# Patient Record
Sex: Female | Born: 1960 | Race: White | Hispanic: No | Marital: Married | Smoking: Never smoker
Health system: Southern US, Community
[De-identification: ages and names within clinical notes are randomized; demographics above are authoritative.]

## PROBLEM LIST (undated history)

## (undated) DIAGNOSIS — I639 Cerebral infarction, unspecified: Secondary | ICD-10-CM

## (undated) DIAGNOSIS — J45909 Unspecified asthma, uncomplicated: Secondary | ICD-10-CM

## (undated) DIAGNOSIS — M1990 Unspecified osteoarthritis, unspecified site: Secondary | ICD-10-CM

## (undated) DIAGNOSIS — F419 Anxiety disorder, unspecified: Secondary | ICD-10-CM

## (undated) DIAGNOSIS — I10 Essential (primary) hypertension: Secondary | ICD-10-CM

## (undated) HISTORY — PX: COSMETIC SURGERY: SHX468

## (undated) HISTORY — PX: TUBAL LIGATION: SHX77

---

## 2015-03-04 ENCOUNTER — Encounter (HOSPITAL_COMMUNITY): Payer: Self-pay | Admitting: *Deleted

## 2015-03-04 ENCOUNTER — Emergency Department (HOSPITAL_COMMUNITY): Payer: Self-pay

## 2015-03-04 ENCOUNTER — Emergency Department (HOSPITAL_COMMUNITY)
Admission: EM | Admit: 2015-03-04 | Discharge: 2015-03-04 | Disposition: A | Discharge status: Home / Self Care | Payer: Self-pay | Attending: Emergency Medicine | Admitting: Emergency Medicine

## 2015-03-04 DIAGNOSIS — J45901 Unspecified asthma with (acute) exacerbation: Secondary | ICD-10-CM | POA: Insufficient documentation

## 2015-03-04 HISTORY — DX: Unspecified asthma, uncomplicated: J45.909

## 2015-03-04 LAB — BASIC METABOLIC PANEL
Anion gap: 8 (ref 5–15)
BUN: 8 mg/dL (ref 6–23)
CALCIUM: 8.8 mg/dL (ref 8.4–10.5)
CHLORIDE: 103 mmol/L (ref 96–112)
CO2: 25 mmol/L (ref 19–32)
Creatinine, Ser: 0.56 mg/dL (ref 0.50–1.10)
GFR calc Af Amer: >90 mL/min (ref >90)
GFR calc non Af Amer: >90 mL/min (ref >90)
Glucose, Bld: 110 mg/dL — ABNORMAL HIGH (ref 70–99)
Potassium: 3.6 mmol/L (ref 3.5–5.1)
Sodium: 136 mmol/L (ref 135–145)

## 2015-03-04 LAB — CBC
HCT: 39.8 % (ref 36.0–46.0)
Hemoglobin: 13.8 g/dL (ref 12.0–15.0)
MCH: 33.3 pg (ref 26.0–34.0)
MCHC: 34.7 g/dL (ref 30.0–36.0)
MCV: 96.1 fL (ref 78.0–100.0)
PLATELETS: 112 10*3/uL — AB (ref 150–400)
RBC: 4.14 MIL/uL (ref 3.87–5.11)
RDW: 12.6 % (ref 11.5–15.5)
WBC: 4 10*3/uL (ref 4.0–10.5)

## 2015-03-04 MED ORDER — IPRATROPIUM-ALBUTEROL 0.5-2.5 (3) MG/3ML IN SOLN
RESPIRATORY_TRACT | Status: AC
  Filled 2015-03-04: qty 3

## 2015-03-04 MED ORDER — IPRATROPIUM-ALBUTEROL 0.5-2.5 (3) MG/3ML IN SOLN
3.0000 mL | Freq: Once | RESPIRATORY_TRACT | Status: AC
  Administered 2015-03-04: 3 mL via RESPIRATORY_TRACT

## 2015-03-04 MED ORDER — METHYLPREDNISOLONE SODIUM SUCC 125 MG IJ SOLR
125.0000 mg | Freq: Once | INTRAMUSCULAR | Status: AC
  Administered 2015-03-04: 125 mg via INTRAVENOUS
  Filled 2015-03-04: qty 2

## 2015-03-04 MED ORDER — ALBUTEROL (5 MG/ML) CONTINUOUS INHALATION SOLN: 10.0000 mg/h | INHALATION_SOLUTION | RESPIRATORY_TRACT | Status: DC

## 2015-03-04 MED ORDER — ALBUTEROL SULFATE HFA 108 (90 BASE) MCG/ACT IN AERS
2.0000 | INHALATION_SPRAY | RESPIRATORY_TRACT | Status: AC
  Administered 2015-03-04: 2 via RESPIRATORY_TRACT
  Filled 2015-03-04: qty 6.7

## 2015-03-04 MED ORDER — ALBUTEROL (5 MG/ML) CONTINUOUS INHALATION SOLN
10.0000 mg/h | INHALATION_SOLUTION | Freq: Once | RESPIRATORY_TRACT | Status: AC
  Administered 2015-03-04: 10 mg/h via RESPIRATORY_TRACT
  Filled 2015-03-04: qty 20

## 2015-03-04 NOTE — Discharge Instructions (Signed)
Please follow the directions provided. Be sure to follow-up with your primary care doctor to ensure you're getting better. Please use your albuterol 2 puffs or neb treatment every 4 hours while you're taking the prednisone. After the course of prednisone is done, you may use only as needed for cough, wheezing or shortness of breath. While taking every 4 hours if you feel you should need it more than 4 hours should return to be reevaluated. Take the prednisone as directed. Don't hesitate to return for any new, worsening, or concerning symptoms.   SEEK IMMEDIATE MEDICAL CARE IF:  You seem to be getting worse and are unresponsive to treatment during an asthma attack.  You are short of breath even at rest.  You get short of breath when doing very little physical activity.  You have difficulty eating, drinking, or talking due to asthma symptoms.  You develop chest pain.  You develop a fast heartbeat.  You have a bluish color to your lips or fingernails.  You are light-headed, dizzy, or faint.

## 2015-03-04 NOTE — ED Provider Notes (Signed)
CSN: [id number]     Arrival date & time 03/04/15  0701 History   First MD Initiated Contact with Patient 03/04/15 [phone]     Chief Complaint  Patient presents with  . Asthma   (Consider location/radiation/quality/duration/timing/severity/associated sxs/prior Treatment) HPI   Sophia Salazar is a 54 yo female presenting with wheezing and shortness of breath. She states she has a history of asthma and her symptoms have been worsening over last 3 days. She states she is not a smoker but she is exposed to cigarette smoke occasionally in the last 3 days she's also been exposed to dust and debris from yard work. She was seen by her PCP yesterday and started on DuoNeb every 4 hours and prednisone without relief of symptoms. She denies productive cough, fevers, chills, chest pain, nausea or vomiting.  Past Medical History  Diagnosis Date  . Asthma    Past Surgical History  Procedure Laterality Date  . Tubal ligation    . Cosmetic surgery      on R arm   No family history on file. History  Substance Use Topics  . Smoking status: Never Smoker   . Smokeless tobacco: Not on file  . Alcohol Use: 1.8 oz/week    3 Glasses of wine per week   OB History    No data available     Review of Systems  Constitutional: Negative for fever and chills.  HENT: Negative for sore throat.   Eyes: Negative for visual disturbance.  Respiratory: Positive for cough, shortness of breath and wheezing. Negative for chest tightness.   Cardiovascular: Negative for chest pain and leg swelling.  Gastrointestinal: Negative for nausea, vomiting and diarrhea.  Genitourinary: Negative for dysuria.  Musculoskeletal: Negative for myalgias.  Skin: Negative for rash.  Neurological: Negative for weakness, numbness and headaches.    Allergies  Review of patient's allergies indicates no known allergies.  Home Medications   Prior to Admission medications   Not on File   BP 198/124 mmHg  Pulse 112  Temp(Src) 97.8 F  (36.6 C) (Oral)  Resp 21  Ht 5\' 7"  (1.702 m)  Wt 170 lb (77.111 kg)  BMI 26.62 kg/m2  SpO2 98% Physical Exam  Constitutional: She appears well-developed and well-nourished. No distress.  HENT:  Head: Normocephalic and atraumatic.  Mouth/Throat: Oropharynx is clear and moist. No oropharyngeal exudate.  Eyes: Conjunctivae are normal.  Neck: Neck supple. No thyromegaly present.  Cardiovascular: Normal rate, regular rhythm and intact distal pulses.   Pulmonary/Chest: Effort normal. Tachypnea noted. No respiratory distress. She has no decreased breath sounds. She has wheezes in the right upper field, the right middle field, the right lower field, the left upper field, the left middle field and the left lower field. She has no rhonchi. She has no rales. She exhibits no tenderness.  Abdominal: Soft. There is no tenderness.  Musculoskeletal: She exhibits no tenderness.  Lymphadenopathy:    She has no cervical adenopathy.  Neurological: She is alert.  Skin: Skin is warm and dry. No rash noted. She is not diaphoretic.  Psychiatric: She has a normal mood and affect.  Nursing note and vitals reviewed.   ED Course  Procedures (including critical care time)  Labs Review Labs Reviewed  CBC - Abnormal; Notable for the following:    Platelets 112 (*)    All other components within normal limits  BASIC METABOLIC PANEL - Abnormal; Notable for the following:    Glucose, Bld 110 (*)    All other  components within normal limits    Imaging Review Dg Chest Port 1 View  03/04/2015   CLINICAL DATA:  Asthma  EXAM: PORTABLE CHEST - 1 VIEW  COMPARISON:  None.  FINDINGS: Cardiomediastinal silhouette is unremarkable. Mild hyperinflation. No infiltrate or pleural effusion. No pulmonary edema.  IMPRESSION: No active disease.  Mild hyperinflation.   Electronically Signed   By: Natasha Mead M.D.   On: 03/04/2015 08:04     EKG Interpretation   Date/Time:  Thursday March 04 2015 07:08:05 EST Ventricular  Rate:  115 PR Interval:  152 QRS Duration: 79 QT Interval:  338 QTC Calculation: 467 R Axis:   73 Text Interpretation:  Sinus tachycardia Artifact in lead(s) II III aVR aVL  aVF V1 V4 V5 V6 No old tracing to compare Confirmed by WARD,  DO, KRISTEN  ([id number]) on 03/04/2015 7:10:12 AM      MDM   Final diagnoses:  Asthma exacerbation   54 yo with asthma exacerbation. Pt with only minimal improvement after duoneb. Continuous neb, IV solumedrol, and CXR done. CXR is negative for acute abnormality.and air movement improved and wheezing decreased after treatment. She ambulated in ED with O2 saturations maintained >94% and, no current signs of respiratory distress. Pt has prescription from PCP for prednisone and albuterol. She has also recently been diagnosed with htn and has yet to start her prescription. She states she is breathing at baseline. Pt has been instructed to continue using prescribed medications and to speak with PCP about today's exacerbation. Pt is well-appearing, in no acute distress and vital signs reviewed and not concerning. She appears safe to be discharged.  Return precautions provided. Pt aware of plan and in agreement.    Filed Vitals:   03/04/15 0800 03/04/15 0830 03/04/15 0855 03/04/15 0925  BP: 187/95 174/92 174/92   Pulse: 94 116 119 116  Temp:   98.2 F (36.8 C)   TempSrc:   Oral   Resp: Height:      Weight:      SpO2: 100% 100% 95% 94%   Meds given in ED:  Medications  ipratropium-albuterol (DUONEB) 0.5-2.5 (3) MG/3ML nebulizer solution ( Nebulization Duplicate 03/04/15 0710)  ipratropium-albuterol (DUONEB) 0.5-2.5 (3) MG/3ML nebulizer solution 3 mL (3 mLs Nebulization Given 03/04/15 0710)  methylPREDNISolone sodium succinate (SOLU-MEDROL) 125 mg/2 mL injection 125 mg (125 mg Intravenous Given 03/04/15 0739)  albuterol (PROVENTIL,VENTOLIN) solution continuous neb (10 mg/hr Nebulization Given 03/04/15 0735)  albuterol (PROVENTIL HFA;VENTOLIN HFA) 108  (90 BASE) MCG/ACT inhaler 2 puff (2 puffs Inhalation Given 03/04/15 0922)    Discharge Medication List as of 03/04/2015  9:11 AM         Harle Battiest, NP 03/05/15 4010  Toy Cookey, MD 03/05/15 [phone]

## 2015-03-04 NOTE — ED Notes (Signed)
Pt in from home c/o [place] worsening x 3 days, pt seen pcp yesterday with rx of inhaler, abx, & prednisone, pt did not get Rx, pt has auditory wheezes & SOB with non productive cough upon arrival to ED, pt speaks in short sentences

## 2016-05-14 IMAGING — CR DG CHEST 1V PORT
2 series · 2 of 2 positions shown · non-contrast
Comparison: None.

CLINICAL DATA: Asthma

EXAM:
PORTABLE CHEST - 1 VIEW

[AP (1 of 2)]
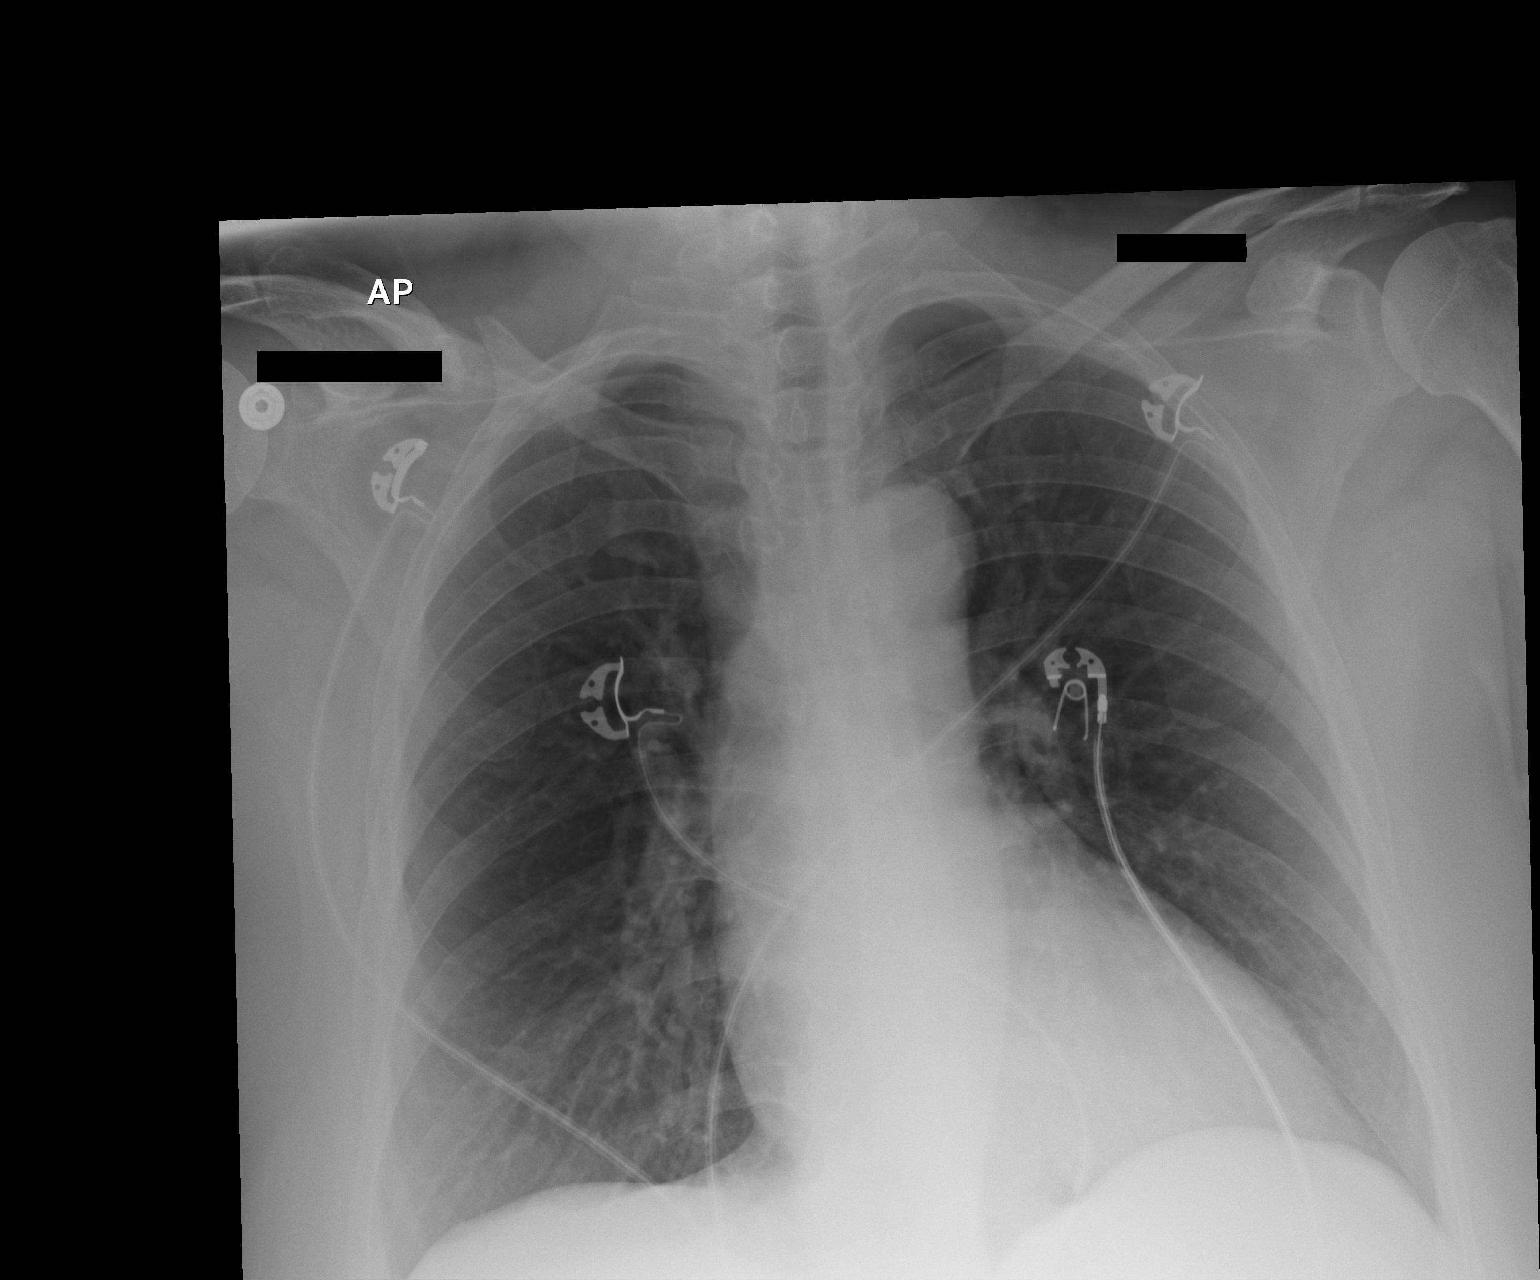

[AP (2 of 2)]
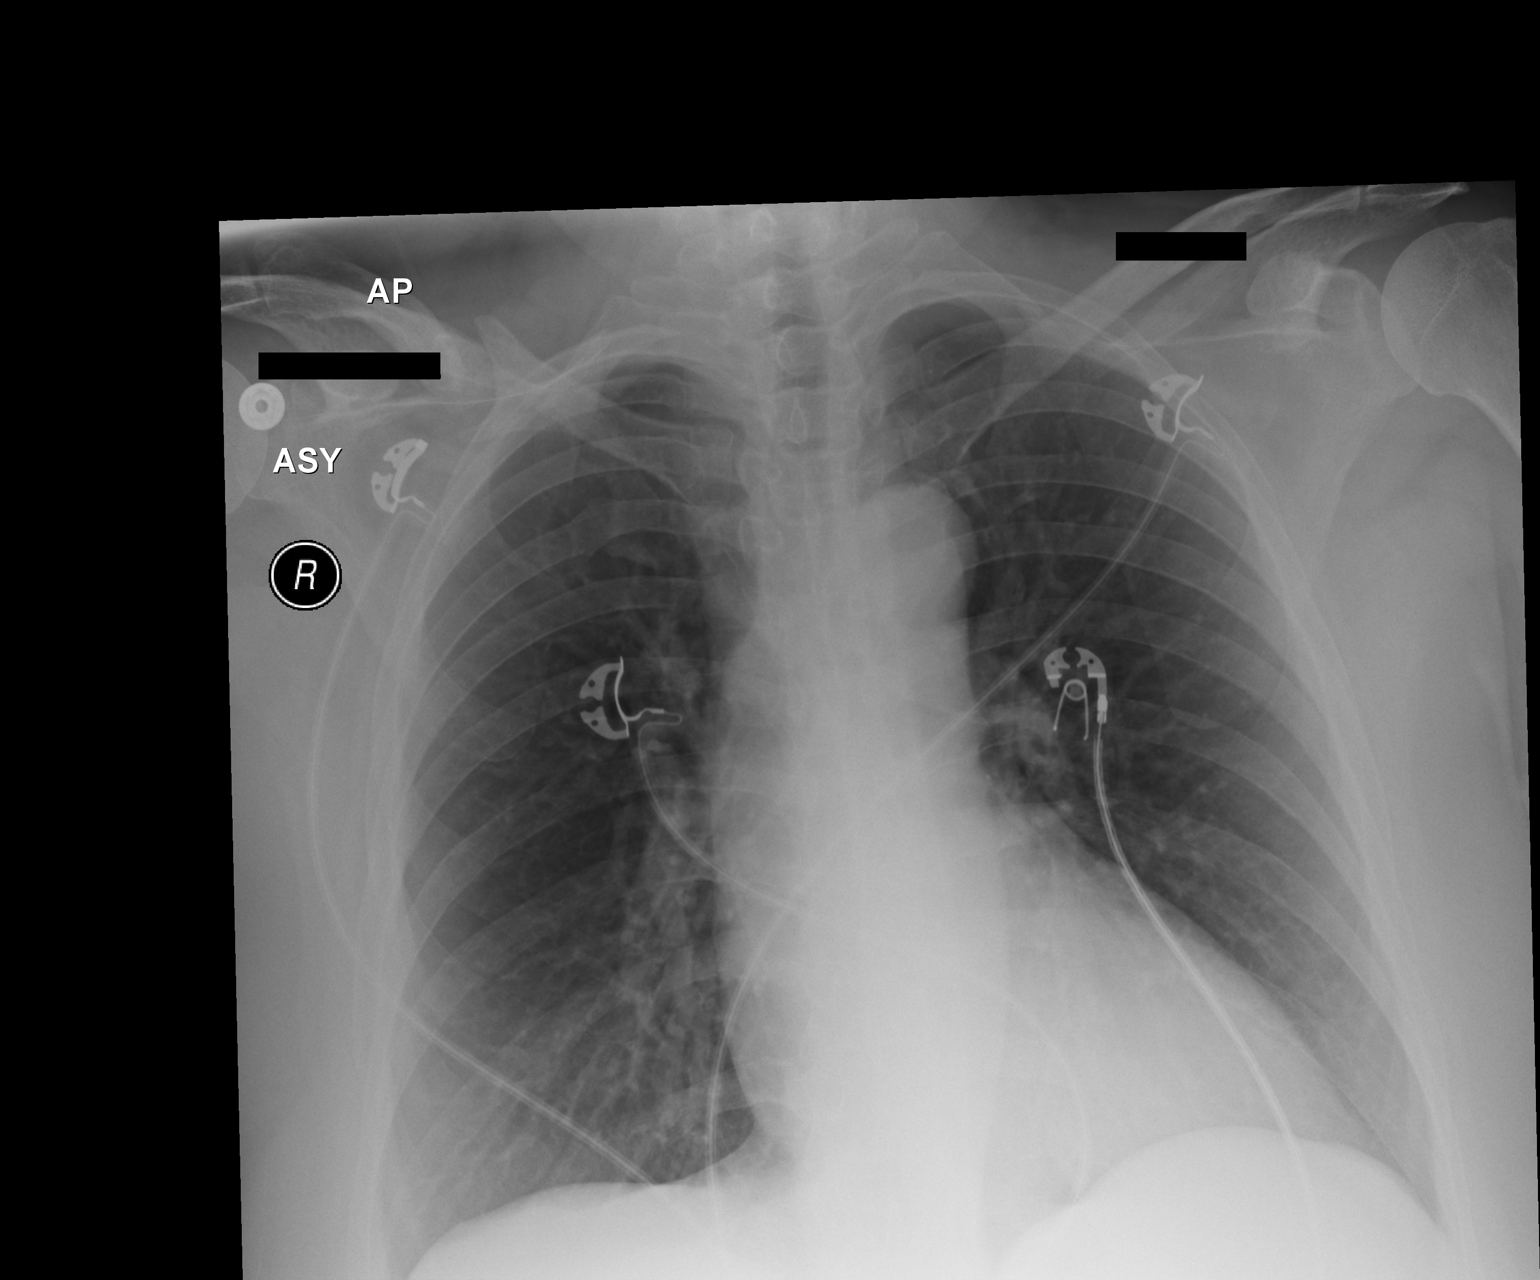

[2 of 2 positions shown; findings below may reference images not displayed]

FINDINGS: Cardiomediastinal silhouette is unremarkable. Mild hyperinflation.
No infiltrate or pleural effusion. No pulmonary edema.
IMPRESSION: No active disease.  Mild hyperinflation.

## 2022-04-04 ENCOUNTER — Telehealth: Payer: Self-pay

## 2022-04-04 NOTE — Telephone Encounter (Signed)
Called patient 1 time to try and schedule an appt from referral. Unable to leave a voicemail at this time due to the phone being busy.  ?

## 2022-04-23 ENCOUNTER — Encounter (HOSPITAL_COMMUNITY): Payer: Self-pay | Admitting: *Deleted

## 2022-04-23 ENCOUNTER — Ambulatory Visit (HOSPITAL_COMMUNITY)
Admission: EM | Admit: 2022-04-23 | Discharge: 2022-04-23 | Disposition: A | Discharge status: Home / Self Care | Payer: 59

## 2022-04-23 DIAGNOSIS — L0291 Cutaneous abscess, unspecified: Secondary | ICD-10-CM

## 2022-04-23 DIAGNOSIS — K611 Rectal abscess: Secondary | ICD-10-CM

## 2022-04-23 HISTORY — DX: Unspecified osteoarthritis, unspecified site: M19.90

## 2022-04-23 HISTORY — DX: Cerebral infarction, unspecified: I63.9

## 2022-04-23 MED ORDER — ACETAMINOPHEN 500 MG PO TABS: 500.0000 mg | ORAL_TABLET | Freq: Four times a day (QID) | ORAL | 0 refills | Status: DC | PRN

## 2022-04-23 MED ORDER — DOXYCYCLINE HYCLATE 100 MG PO CAPS: 100.0000 mg | ORAL_CAPSULE | Freq: Two times a day (BID) | ORAL | 0 refills | Status: DC

## 2022-04-23 NOTE — ED Notes (Signed)
Provider performed I&D rectal abscess with RN assist. ?

## 2022-04-23 NOTE — ED Triage Notes (Signed)
C/O right side perineal discomfort x 4 days - thought hemorrhoid; 2 days ago worse - started using hemorrhoid suppositories without relief, and significant worsening. ?Has been doing Epsom salt hot water baths; started with some drainage from abscess today. ? ?

## 2022-04-23 NOTE — Discharge Instructions (Addendum)
You were seen for your abscess today.  I have left this wound open so that it can drain.  Please wash this wound with soap and water daily and do not apply any ointments, creams, powders, or lotions to the wound for at least 10 days while it heals.  I have prescribed doxycycline for you to take twice a day for 10 days to heal infection.  Please return to the clinic on May 8 for a recheck to make sure that your wound is healing properly. ? ?Please change the dressing twice daily and use gauze in your underwear to absorb any drainage from your abscess.  You may take Tylenol as needed for any pain you may have and apply ice to the area to decrease inflammation. ? ?If you notice any signs of infection such as worsening redness, swelling, or if the drainage is continuing to increase in amount or if you develop a fever, please return to the clinic sooner than 1 week or go to the ER for severe symptoms. ?

## 2022-04-23 NOTE — ED Provider Notes (Signed)
?[facility] ? ? ? ?CSN: [id number] ?Arrival date & time: 04/23/22  1353 ? ? ?  ? ?History   ?Chief Complaint ?Chief Complaint  ?Patient presents with  ? Abscess  ? ? ?HPI ?Sophia Salazar is a 61 y.o. female.  ? ?Patient presents urgent care for evaluation of her "knot" that became more painful on Thursday 3 days ago.  She does not recall when she first noticed this not, but investigated 3 days ago when she noticed that she had significant pain to her right perianal area.  The abscess grew in size significantly over the last 2 days and became significantly more painful to the point where she could not sit on her bottom without pain.  Patient did warm compresses at home as well as Epsom salt soaks in an attempt to reduce the swelling, but these interventions did not help.  Denies taking any medications for this at home.  She has never had an abscess in this location before.  Denies any other aggravating or relieving factors.  Denies fever, nausea, vomiting, headache, lightheadedness, and dizziness.  She states that the abscess began to drain today and wanted to take care of it at home, but could not get the swelling to go down.   ? ? ?Abscess ? ?Past Medical History:  ?Diagnosis Date  ? Arthritis   ? Asthma   ? Stroke [facility])   ? 1994?  ? ? ?There are no problems to display for this patient. ? ? ?Past Surgical History:  ?Procedure Laterality Date  ? COSMETIC SURGERY    ? on R arm  ? TUBAL LIGATION    ? ? ?OB History   ?No obstetric history on file. ?  ? ? ? ?Home Medications   ? ?Prior to Admission medications   ?Medication Sig Start Date End Date Taking? Authorizing Provider  ?acetaminophen (TYLENOL) 500 MG tablet Take 1 tablet (500 mg total) by mouth every 6 (six) hours as needed. 04/23/22  Yes Carlisle Beers, FNP  ?aspirin 325 MG tablet Take 325 mg by mouth daily.   Yes [provider]  ?doxycycline (VIBRAMYCIN) 100 MG capsule Take 1 capsule (100 mg total) by mouth 2 (two) times daily. 04/23/22   Yes Carlisle Beers, FNP  ?LISINOPRIL PO Take by mouth.   Yes [provider]  ?MAGNESIUM PO Take by mouth.   Yes [provider]  ?MELOXICAM PO Take by mouth.   Yes [provider]  ?Multiple Vitamin (MULTIVITAMIN PO) Take by mouth.   Yes [provider]  ?POTASSIUM PO Take by mouth.   Yes [provider]  ?TURMERIC PO Take by mouth.   Yes [provider]  ?albuterol (PROVENTIL HFA;VENTOLIN HFA) 108 (90 BASE) MCG/ACT inhaler Inhale 1 puff into the lungs every 6 (six) hours as needed for wheezing or shortness of breath.    [provider]  ?albuterol (PROVENTIL) (2.5 MG/3ML) 0.083% nebulizer solution Take 2.5 mg by nebulization every 6 (six) hours as needed for wheezing or shortness of breath.    [provider]  ?Chlorpheniramine-Phenylephrine 4-10 MG per tablet Take 1 tablet by mouth every 4 (four) hours as needed for congestion.    [provider]  ?guaiFENesin (MUCINEX) 600 MG 12 hr tablet Take 600 mg by mouth 2 (two) times daily.    [provider]  ?ipratropium-albuterol (DUONEB) 0.5-2.5 (3) MG/3ML SOLN Take 3 mLs by nebulization.    [provider]  ?Liniments (BEN GAY EX) Apply 1  application topically 2 (two) times daily as needed (muscle soreness).    [provider]  ?OVER THE COUNTER MEDICATION Apply 1 drop to eye 2 (two) times daily as needed (OTC Allergy relief).    [provider]  ? ? ?Family History ?History reviewed. No pertinent family history. ? ?Social History ?Social History  ? ?Tobacco Use  ? Smoking status: Never  ? Smokeless tobacco: Never  ?Vaping Use  ? Vaping Use: Never used  ?Substance Use Topics  ? Alcohol use: Yes  ?  Alcohol/week: 1.0 standard drink  ?  Types: 1 Glasses of wine per week  ? Drug use: No  ? ? ? ?Allergies   ?Patient has no known allergies. ? ? ?Review of Systems ?Review of Systems ?Per HPI ? ?Physical Exam ?Triage Vital Signs ?ED Triage Vitals  ?Enc  Vitals Group  ?   BP 04/23/22 1547 (!) 150/101  ?   Pulse Rate 04/23/22 1547 (!) 107  ?   Resp 04/23/22 1547 (!) 22  ?   Temp 04/23/22 1547 98.4 ?F (36.9 ?C)  ?   Temp Source 04/23/22 1547 Oral  ?   SpO2 04/23/22 1547 97 %  ?   Weight --   ?   Height --   ?   Head Circumference --   ?   Peak Flow --   ?   Pain Score 04/23/22 1548 8  ?   Pain Loc --   ?   Pain Edu? --   ?   Excl. in GC? --   ? ?No data found. ? ?Updated Vital Signs ?BP (!) 165/92 (BP Location: Left Arm)   Pulse (!) 107   Temp 98.4 ?F (36.9 ?C) (Oral)   Resp (!) 22   SpO2 97%  ? ?Visual Acuity ?Right Eye Distance:   ?Left Eye Distance:   ?Bilateral Distance:   ? ?Right Eye Near:   ?Left Eye Near:    ?Bilateral Near:    ? ?Physical Exam ?Vitals and nursing note reviewed. Exam conducted with a chaperone present.  ?Constitutional:   ?   General: She is not in acute distress. ?   Appearance: Normal appearance. She is well-developed. She is not ill-appearing.  ?HENT:  ?   Head: Normocephalic and atraumatic.  ?   Right Ear: External ear normal.  ?   Left Ear: External ear normal.  ?   Nose: Nose normal.  ?   Mouth/Throat:  ?   Mouth: Mucous membranes are moist.  ?Eyes:  ?   Extraocular Movements: Extraocular movements intact.  ?   Conjunctiva/sclera: Conjunctivae normal.  ?Cardiovascular:  ?   Rate and Rhythm: Normal rate and regular rhythm.  ?   Heart sounds: Normal heart sounds. No murmur heard. ?  No friction rub. No gallop.  ?Pulmonary:  ?   Effort: Pulmonary effort is normal. No respiratory distress.  ?   Breath sounds: Normal breath sounds. No wheezing, rhonchi or rales.  ?Chest:  ?   Chest wall: No tenderness.  ?Abdominal:  ?   Palpations: Abdomen is soft.  ?   Tenderness: There is no abdominal tenderness. There is no right CVA tenderness or left CVA tenderness.  ?Genitourinary: ?   Exam position: Knee-chest position.  ?   Rectum: Normal.  ? ? ?   Comments: Abscess to perirectal area inferior and lateral to the rectum and extending to the right  gluteus muscle. Area of greatest fluctuance marked with a red X and  total abscess area noted by purple in the diagram above. Wound draining without intervention. Purulent and serosanguinous viscous drainage coming from wound with minimal palpation. Patient very tender to abscess. Rectum appears normal. Area surrounding area of greatest fluctuance is very erythematous and warm to touch.   ?Musculoskeletal:     ?   General: No swelling.  ?   Cervical back: Neck supple.  ?Skin: ?   General: Skin is warm and dry.  ?   Capillary Refill: Capillary refill takes less than 2 seconds.  ?   Findings: No rash.  ?Neurological:  ?   General: No focal deficit present.  ?   Mental Status: She is alert and oriented to person, place, and time.  ?Psychiatric:     ?   Mood and Affect: Mood normal.     ?   Behavior: Behavior normal.     ?   Thought Content: Thought content normal.     ?   Judgment: Judgment normal.  ? ?Abscess to right perirectal area ? ?UC Treatments / Results  ?Labs ?(all labs ordered are listed, but only abnormal results are displayed) ?Labs Reviewed - No data to display ? ?EKG ? ? ?Radiology ?No results found. ? ?Procedures ?Incision and Drainage ? ?Date/Time: 04/24/2022 8:59 PM ?Performed by: Carlisle BeersStanhope, Rondell Frick M, FNP ?Authorized by: Carlisle BeersStanhope, Ericberto Padget M, FNP  ? ?Consent:  ?  Consent obtained:  Verbal ?  Consent given by:  Patient ?  Risks discussed:  Bleeding, incomplete drainage, pain, infection and damage to other organs ?  Alternatives discussed:  No treatment ?Universal protocol:  ?  Procedure explained and questions answered to patient or proxy's satisfaction: yes   ?Location:  ?  Type:  Abscess ?  Size:  5-6cm ?  Location:  Anogenital ?  Anogenital location:  Perirectal ?Pre-procedure details:  ?  Skin preparation:  Povidone-iodine ?Sedation:  ?  Sedation type:  None ?Anesthesia:  ?  Anesthesia method:  Local infiltration ?  Local anesthetic:  Lidocaine 1% w/o epi ?Procedure type:  ?  Complexity:   Simple ?Procedure details:  ?  Incision types:  Stab incision ?  Incision depth:  Subcutaneous ?  Wound management:  Irrigated with saline ?  Drainage:  Purulent, bloody and serosanguinous ?  Drainage amount:  Cop[place]

## 2022-04-28 ENCOUNTER — Ambulatory Visit: Payer: 59 | Admitting: Orthopaedic Surgery

## 2022-05-01 ENCOUNTER — Ambulatory Visit (HOSPITAL_COMMUNITY): Payer: 59

## 2022-06-16 ENCOUNTER — Ambulatory Visit (INDEPENDENT_AMBULATORY_CARE_PROVIDER_SITE_OTHER): Payer: 59

## 2022-06-16 ENCOUNTER — Ambulatory Visit (INDEPENDENT_AMBULATORY_CARE_PROVIDER_SITE_OTHER): Payer: 59 | Admitting: Orthopaedic Surgery

## 2022-06-16 ENCOUNTER — Encounter: Payer: Self-pay | Admitting: Orthopaedic Surgery

## 2022-06-16 VITALS — Ht 66.0 in | Wt 165.0 lb

## 2022-06-16 DIAGNOSIS — M25511 Pain in right shoulder: Secondary | ICD-10-CM

## 2022-06-16 DIAGNOSIS — G8929 Other chronic pain: Secondary | ICD-10-CM

## 2022-06-16 MED ORDER — TRAMADOL HCL 50 MG PO TABS: 50.0000 mg | ORAL_TABLET | Freq: Two times a day (BID) | ORAL | 2 refills | Status: DC | PRN

## 2022-07-14 ENCOUNTER — Ambulatory Visit (INDEPENDENT_AMBULATORY_CARE_PROVIDER_SITE_OTHER): Payer: 59 | Admitting: Family Medicine

## 2022-07-14 ENCOUNTER — Ambulatory Visit: Payer: Self-pay

## 2022-07-14 ENCOUNTER — Encounter: Payer: Self-pay | Admitting: Family Medicine

## 2022-07-14 VITALS — BP 160/100 | HR 88 | Ht 66.0 in | Wt 177.2 lb

## 2022-07-14 DIAGNOSIS — M25511 Pain in right shoulder: Secondary | ICD-10-CM | POA: Diagnosis not present

## 2022-07-14 DIAGNOSIS — G8929 Other chronic pain: Secondary | ICD-10-CM

## 2022-07-14 DIAGNOSIS — F40298 Other specified phobia: Secondary | ICD-10-CM

## 2022-07-14 MED ORDER — LORAZEPAM 0.5 MG PO TABS: ORAL_TABLET | ORAL | 0 refills | Status: DC

## 2022-07-14 MED ORDER — LIDOCAINE-PRILOCAINE 2.5-2.5 % EX CREA: 1.0000 | TOPICAL_CREAM | CUTANEOUS | 0 refills | Status: DC | PRN

## 2022-07-14 NOTE — Patient Instructions (Addendum)
Nice to meet you today.  Follow-up: when you can.  Apply Emla cream one hour prior to injection.

## 2022-07-14 NOTE — Progress Notes (Signed)
Subjective:    CC: R shoulder pain  I, Sophia Salazar, LAT, ATC, am serving as scribe for Dr. [name].  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 Roda Shutters on 06/16/22. She notes she fell onto her right shoulder back in 2014 breaking her clavicle.  She treated this herself with a sling for several months. Pt locates pain to her entire R shoulder w/ radiating pain into her entire R arm to her wrist.  She also has issues w/ her neck and is supposed to have a cervical fusion per a provider at [facility].    Radiating pain: yes into her entire R arm to her R wrist. R shoulder mechanical symptoms: yes R UE paresthesias: yes to her R elbow Aggravates: R shoulder AROM above 65-70 deg Treatments tried: Tylenol, meloxicam; heat; ice; lidocaine patches; Voltaren gel;   Dx imaging: 06/16/22 R shoulder XR  Pertinent review of Systems: No fevers or chills  Relevant historical information: Anxiety.  Hypertension.   Objective:    Vitals:   07/14/22 1028  BP: (!) 160/100  Pulse: 88  SpO2: 96%   General: Well Developed, well nourished, anxious appearing  MSK: Right shoulder: Tender palpation AC joint.  Decreased shoulder motion.  Lab and Radiology Results  X-ray images right shoulder obtained at orthopedic office on June 16, 2022 personally and independently interpreted. 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   Impression and Recommendations:    Assessment and Plan: 61 y.o. female with right shoulder pain.  Multifactorial.  A fair amount of her pain probably is coming from the [facility] joint and a diagnostic and hopefully therapeutic injection at the [facility] joint would be helpful and clarify a lot of her pain.  Ultimately if we can prove that her pain is coming from her [facility] joint and distal clavicle excision by Dr Roda Shutters would be helpful.  The goal today  was to do perform such an injection however Lahari is very anxious about injections.  We talked a lot about the shot and planned to set her cells up for success by prescribing Emla cream and Ativan.  She will apply the Emla cream 1 hour prior to the injection and take Ativan 1 hour prior to the injection in the near future.  This should allow for a safe and less stressful injection for [name]. Return in the near future for injection.  PDMP reviewed during this encounter. Orders Placed This Encounter  Procedures   [place] LIMITED JOINT SPACE STRUCTURES UP RIGHT(NO LINKED CHARGES)    Order Specific Question:   Reason for Exam (SYMPTOM  OR DIAGNOSIS REQUIRED)    Answer:   R shoulder pain    Order Specific Question:   Preferred imaging location?    Answer:   Adult [facility] ordered this encounter  Medications   LORazepam (ATIVAN) 0.5 MG tablet    Sig: 1-2 tabs 30 - 60 min prior to MRI. Do not drive with this medicine.    Dispense:  4 tablet    Refill:  0   lidocaine-prilocaine (EMLA) cream    Sig: Apply 1 Application topically as needed.    Dispense:  30 g    Refill:  0    Discussed warning signs or symptoms. Please see discharge instructions. Patient expresses understanding.   Total encounter time 30 minutes  including face-to-face time with the patient and, reviewing past medical record, and charting on the date of service.    The above documentation has been reviewed and is accurate and complete [name], M.D.

## 2022-10-16 ENCOUNTER — Ambulatory Visit (INDEPENDENT_AMBULATORY_CARE_PROVIDER_SITE_OTHER): Payer: Commercial Managed Care - HMO

## 2022-10-16 ENCOUNTER — Ambulatory Visit: Payer: Commercial Managed Care - HMO | Admitting: Family Medicine

## 2022-10-16 ENCOUNTER — Ambulatory Visit: Payer: Self-pay

## 2022-10-16 VITALS — BP 158/92 | HR 101 | Ht 66.0 in | Wt 173.0 lb

## 2022-10-16 DIAGNOSIS — G8929 Other chronic pain: Secondary | ICD-10-CM | POA: Diagnosis not present

## 2022-10-16 DIAGNOSIS — M25562 Pain in left knee: Secondary | ICD-10-CM | POA: Diagnosis not present

## 2022-10-16 NOTE — Patient Instructions (Addendum)
Thank you for coming in today.   Please get an Xray today before you leave   You received an injection today. Seek immediate medical attention if the joint becomes red, extremely painful, or is oozing fluid.   Check back in 6 weeks

## 2022-10-16 NOTE — Progress Notes (Signed)
I, Sophia Salazar, LAT, ATC acting as a scribe for Sophia Graham, MD.  Sophia Salazar is a 61 y.o. female who presents to [facility] at [facility] today for left knee pain.  Patient was last seen by Dr. Denyse Salazar on 07/14/2022 for chronic right shoulder pain.  Today, patient reports L knee pain ongoing for 1-1.5 month. No mechanism or trauma. Pt notes pain is like a "stabbing" pain in her L knee when it occurs. Pt locates pain to the anterior aspect of the L knee.  L Knee swelling: yes Mechanical symptoms: yes- worse on stairs Aggravates: stairs, normal activities, at night Treatments tried: Voltaren gel, salon pas, knee compression sleeve  Pertinent review of systems: No fevers or chills  Relevant historical information: Hypertension   Exam:  BP (!) 158/92   Pulse (!) 101   Ht 5\' 6"  (1.676 m)   Wt 173 lb (78.5 kg)   SpO2 98%   BMI 27.92 kg/m  General: Well Developed, well nourished, and in no acute distress.   MSK: Left knee normal-appearing Normal motion with crepitation.  Tender palpation medial joint line. Stable ligamentous exam. Positive medial McMurray's test. Intact strength. Mild antalgic gait.    Lab and Radiology Results  Procedure: Real-time Ultrasound Guided Injection of left knee superior lateral patellar space Device: Philips Affiniti 50G Images permanently stored and available for review in PACS Ultrasound evaluation prior to injection reveals degenerative medial and lateral menisci.  Mild joint effusion is present. Verbal informed consent obtained.  Discussed risks and benefits of procedure. Warned about infection, bleeding, hyperglycemia damage to structures among others. Patient expresses understanding and agreement Time-out conducted.   Noted no overlying erythema, induration, or other signs of local infection.   Skin prepped in a sterile fashion.   Local anesthesia: Topical Ethyl chloride.   With sterile technique and under real time  ultrasound guidance: 40 mg of Kenalog and 2 mL of Marcaine injected into knee joint. Fluid seen entering the joint capsule.   Completed without difficulty   Pain immediately resolved suggesting accurate placement of the medication.   Advised to call if fevers/chills, erythema, induration, drainage, or persistent bleeding.   Images permanently stored and available for review in the ultrasound unit.  Impression: Technically successful ultrasound guided injection.   X-ray images left knee obtained today personally and independently interpreted Mild medial DJD.  No acute fractures are present. Await formal radiology review     Assessment and Plan: 61 y.o. female with left knee pain.  Pain is thought to be due to degenerative meniscus tear or exacerbation of DJD.  Plan for steroid injection.  Recommend Voltaren gel as well.  Recommend against high-dose oral NSAIDs.  Check back in 6 weeks.  Return sooner if needed.   PDMP not reviewed this encounter. Orders Placed This Encounter  Procedures   DG Knee AP/LAT W/Sunrise Left    Standing Status:   Future    Number of Occurrences:   1    Standing Expiration Date:   11/16/2022    Order Specific Question:   Reason for Exam (SYMPTOM  OR DIAGNOSIS REQUIRED)    Answer:   left knee pain    Order Specific Question:   Preferred imaging location?    Answer:   11/18/2022    Order Specific Question:   Is patient pregnant?    Answer:   No   Kyra Salazar LIMITED JOINT SPACE STRUCTURES LOW LEFT(NO LINKED CHARGES)    Order Specific Question:  Reason for Exam (SYMPTOM  OR DIAGNOSIS REQUIRED)    Answer:   left knee pain    Order Specific Question:   Preferred imaging location?    Answer:   [place]   No orders of the defined types were placed in this encounter.    Discussed warning signs or symptoms. Please see discharge instructions. Patient expresses understanding.   The above documentation has been reviewed and is  accurate and complete Sophia Salazar, M.D.

## 2022-10-18 NOTE — Progress Notes (Signed)
Left knee x-ray looks normal to radiology

## 2022-10-19 NOTE — Progress Notes (Signed)
Results given to patiet

## 2022-10-19 NOTE — Progress Notes (Signed)
LVM to call back for results.

## 2022-12-01 ENCOUNTER — Ambulatory Visit: Payer: Commercial Managed Care - HMO | Admitting: Family Medicine

## 2022-12-25 DIAGNOSIS — Z87442 Personal history of urinary calculi: Secondary | ICD-10-CM

## 2022-12-25 HISTORY — DX: Personal history of urinary calculi: Z87.442

## 2023-03-22 DIAGNOSIS — M25562 Pain in left knee: Secondary | ICD-10-CM | POA: Diagnosis not present

## 2023-04-06 DIAGNOSIS — I10 Essential (primary) hypertension: Secondary | ICD-10-CM | POA: Diagnosis not present

## 2023-04-06 DIAGNOSIS — D649 Anemia, unspecified: Secondary | ICD-10-CM | POA: Diagnosis not present

## 2023-04-06 DIAGNOSIS — F419 Anxiety disorder, unspecified: Secondary | ICD-10-CM | POA: Diagnosis not present

## 2023-04-06 DIAGNOSIS — D519 Vitamin B12 deficiency anemia, unspecified: Secondary | ICD-10-CM | POA: Diagnosis not present

## 2023-04-06 DIAGNOSIS — E559 Vitamin D deficiency, unspecified: Secondary | ICD-10-CM | POA: Diagnosis not present

## 2023-04-06 DIAGNOSIS — Z13228 Encounter for screening for other metabolic disorders: Secondary | ICD-10-CM | POA: Diagnosis not present

## 2023-04-06 DIAGNOSIS — F4312 Post-traumatic stress disorder, chronic: Secondary | ICD-10-CM | POA: Diagnosis not present

## 2023-05-09 ENCOUNTER — Ambulatory Visit: Payer: Self-pay

## 2023-05-09 ENCOUNTER — Ambulatory Visit (INDEPENDENT_AMBULATORY_CARE_PROVIDER_SITE_OTHER): Payer: Medicaid Other

## 2023-05-09 ENCOUNTER — Encounter: Payer: Self-pay | Admitting: Family Medicine

## 2023-05-09 ENCOUNTER — Ambulatory Visit: Payer: Medicaid Other | Admitting: Family Medicine

## 2023-05-09 VITALS — BP 140/86 | HR 100 | Ht 66.0 in | Wt 165.0 lb

## 2023-05-09 DIAGNOSIS — G8929 Other chronic pain: Secondary | ICD-10-CM

## 2023-05-09 DIAGNOSIS — M25511 Pain in right shoulder: Secondary | ICD-10-CM

## 2023-05-09 DIAGNOSIS — M19011 Primary osteoarthritis, right shoulder: Secondary | ICD-10-CM | POA: Diagnosis not present

## 2023-05-09 MED ORDER — TRAMADOL HCL 50 MG PO TABS: 50.0000 mg | ORAL_TABLET | Freq: Two times a day (BID) | ORAL | 0 refills | Status: DC | PRN

## 2023-05-09 NOTE — Progress Notes (Signed)
I, Stevenson Clinch, CMA acting as a scribe for [name], MD.  Sophia Salazar is a 62 y.o. female who presents to [facility] at [facility] today for exacerbation of her R shoulder pain. Pt was last seen for her R shoulder on 07/14/22 and she was prescribed EMLA cream and Ativan as she was very anxious about the idea of an injection. Today, pt reports worsening pain over the past several months, but even more so over the past week. Notes sharp shooting pain into the arm, extending beyond the elbow. Also having new pain in the periscapular region. Notes using Swiffer under a couch, heard/felt painful pop. Having tenderness at the clavicle, hx of fracture. Sx were severe initially but have improved somewhat. Notes limited ROM. Having weakness in the arm and decreased grip strength. Neck sx are moderately managed with Chiropractic treatment. Minimal relief with Meloxicam, short-term relief with Tramadol. Minimal relief with topical Voltaren.   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   MSK: Right shoulder: Swelling present at mid clavicle region otherwise normal-appearing Tender palpation at the mid clavicle pseudoarthrosis, AC joint, and diffusely in the trapezius area. Shoulder range of motion is limited abduction to 100 degrees.  Internal rotation lumbar spine external rotation is full. Strength abduction 4/5 external and internal rotation 5/5. Positive Hawkins and Neer's test. Negative Yergason's and speeds test.     Lab and Radiology Results  Procedure: Real-time Ultrasound Guided Injection of right shoulder subacromial bursa Device:  Philips Affiniti 50G Images permanently stored and available for review in PACS Ultrasound evaluation prior to injection reveals mild hypoechoic fluid tracking within biceps tendon sheath. Subscapularis tendon appears to be intact. Supraspinatus tendon thinned at distal tendon insertion could represent partial tear.  No full-thickness rotator cuff tear is visible. Moderate subacromial bursitis is present. Infraspinatus tendon is intact. AC joint degenerative with moderate effusion. Mid clavicle pseudoarthrosis on ultrasound has what looks to be a joint effusion Verbal informed consent obtained.  Discussed risks and benefits of procedure. Warned about infection, bleeding, hyperglycemia damage to structures among others. Patient expresses understanding and agreement Time-out conducted.   Noted no overlying erythema, induration, or other signs of local infection.   Skin prepped in a sterile fashion.   Local anesthesia: Topical Ethyl chloride.   With sterile technique and under real time ultrasound guidance: 40 mg of Kenalog and 2 mL of Marcaine injected into subacromial bursa. Fluid seen entering the bursa.   Completed without difficulty   Pain immediately resolved suggesting accurate placement of the medication.   Advised to call if fevers/chills, erythema, induration, drainage, or persistent bleeding.   Images permanently stored and available for review in the ultrasound unit.  Impression: Technically successful ultrasound guided injection.    X-ray images right shoulder obtained today personally and independently interpreted No severe glute mild DJD.  Pseudoarthrosis again visible mid clavicle.  AC DJD is present.  No acute fractures are visible. Await formal radiology review    Assessment and Plan: 62 y.o. female with right shoulder pain.  This is an acute exacerbation of a chronic problem.  She was doing well until about a week ago when she extended her arm and felt a pop.  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  Plan for trial of subacromial injection and physical therapy.  Physical therapy for her is challenging but the [place] location should be acceptable.  Tramadol refilled.  Recheck in 6 weeks.   PDMP reviewed during this encounter. Orders Placed This Encounter  Procedures   [place] LIMITED JOINT SPACE STRUCTURES UP RIGHT(NO LINKED CHARGES)    Order Specific Question:   Reason for Exam (SYMPTOM  OR DIAGNOSIS REQUIRED)    Answer:   right shoulder pain    Order Specific Question:   Preferred imaging location?    Answer:   [facility] Shoulder Right    Standing Status:   Future    Number of Occurrences:   1    Standing Expiration Date:   05/08/2024    Order Specific Question:   Reason for Exam (SYMPTOM  OR DIAGNOSIS REQUIRED)    Answer:   right shoulder pain    Order Specific Question:   Preferred imaging location?    Answer:   [facility]    Order Specific Question:   Is patient pregnant?    Answer:   No   Ambulatory referral to Physical Therapy    Referral Priority:   Routine    Referral Type:   Physical Medicine    Referral Reason:   Specialty Services Required    Requested Specialty:   Physical Therapy    Number of Visits Requested:   1   Meds ordered this encounter  Medications   traMADol (ULTRAM) 50 MG tablet    Sig: Take 1 tablet (50 mg total) by mouth every 12 (twelve) hours as needed.    Dispense:  15 tablet    Refill:  0     Discussed warning signs or symptoms. Please see discharge instructions. Patient expresses understanding.   The above documentation has been reviewed and is accurate and complete [name], M.D.

## 2023-05-09 NOTE — Patient Instructions (Signed)
Thank you for coming in today.   Please get an Xray today before you leave   You received an injection today. Seek immediate medical attention if the joint becomes red, extremely painful, or is oozing fluid.   I've referred you to Physical Therapy.  Let us know if you don't hear from them in one week.   Check back in 6 weeks   

## 2023-05-10 ENCOUNTER — Telehealth: Payer: Self-pay

## 2023-05-10 NOTE — Telephone Encounter (Signed)
Prior auth required for Tramadol  [email or web address] Key: [id number]

## 2023-05-11 NOTE — Telephone Encounter (Signed)
Prior Authorization initiated for traMADol via [email or web address] KEY: [id number]

## 2023-05-14 NOTE — Progress Notes (Signed)
Right shoulder x-ray shows prior clavicle fracture. Mild arthritis changes present in the main shoulder joint and in the [facility] joint.  No acute fractures are visible.

## 2023-05-16 NOTE — Telephone Encounter (Signed)
Prior auth (clarification) form completed and faxed back to [employer].

## 2023-05-17 NOTE — Telephone Encounter (Signed)
Prior Auth for TRAMADOL approved  PA# [id number] Valid 05/16/23-11/12/23

## 2023-06-07 ENCOUNTER — Ambulatory Visit
Admission: EM | Admit: 2023-06-07 | Discharge: 2023-06-07 | Disposition: A | Discharge status: Home / Self Care | Payer: Medicaid Other | Attending: Family Medicine | Admitting: Family Medicine

## 2023-06-07 ENCOUNTER — Ambulatory Visit: Payer: Medicaid Other

## 2023-06-07 DIAGNOSIS — R3911 Hesitancy of micturition: Secondary | ICD-10-CM | POA: Diagnosis not present

## 2023-06-07 DIAGNOSIS — M545 Low back pain, unspecified: Secondary | ICD-10-CM | POA: Insufficient documentation

## 2023-06-07 DIAGNOSIS — R109 Unspecified abdominal pain: Secondary | ICD-10-CM | POA: Insufficient documentation

## 2023-06-07 LAB — POCT URINALYSIS DIP (MANUAL ENTRY)
Blood, UA: NEGATIVE
Glucose, UA: NEGATIVE mg/dL
Ketones, POC UA: NEGATIVE mg/dL
Nitrite, UA: NEGATIVE
Protein Ur, POC: NEGATIVE mg/dL
Spec Grav, UA: >=1.03 — AB (ref 1.010–1.025)
Urobilinogen, UA: 0.2 E.U./dL
pH, UA: 5.5 (ref 5.0–8.0)

## 2023-06-07 MED ORDER — NITROFURANTOIN MONOHYD MACRO 100 MG PO CAPS: 100.0000 mg | ORAL_CAPSULE | Freq: Two times a day (BID) | ORAL | 0 refills | Status: DC

## 2023-06-07 MED ORDER — TIZANIDINE HCL 4 MG PO TABS: 4.0000 mg | ORAL_TABLET | Freq: Three times a day (TID) | ORAL | 0 refills | Status: DC | PRN

## 2023-06-07 MED ORDER — IBUPROFEN 600 MG PO TABS: 600.0000 mg | ORAL_TABLET | Freq: Three times a day (TID) | ORAL | 0 refills | Status: DC | PRN

## 2023-06-07 NOTE — ED Provider Notes (Signed)
[facility]    CSN: [id number] Arrival date & time: 06/07/23  0909      History   Chief Complaint Chief Complaint  Patient presents with   Back Pain    HPI Sophia Salazar is a 62 y.o. female.    Back Pain  Here for right lower back pain. Began about 2 days ago, and then has worsened. Gets worse with bending at waist or twisting at the torso.  No f/c. No dysuria, but maybe urinary hesitancy and incomplete bladder emptying. No n/v. Maybe lower abd pain today.  Has had chronic right shoulder pain; is rx'd tramadol for that, but shows me her bottle with nearly all of the 15 tabs that were filled on 5/31.  Is not taking any blood thinners; NKDA.   No recent trauma or fall.  Past Medical History:  Diagnosis Date   Arthritis    Asthma    Stroke [facility])    1994?    There are no problems to display for this patient.   Past Surgical History:  Procedure Laterality Date   COSMETIC SURGERY     on R arm   TUBAL LIGATION      OB History   No obstetric history on file.      Home Medications    Prior to Admission medications   Medication Sig Start Date End Date Taking? Authorizing Provider  albuterol (PROVENTIL HFA;VENTOLIN HFA) 108 (90 BASE) MCG/ACT inhaler Inhale 1 puff into the lungs every 6 (six) hours as needed for wheezing or shortness of breath.   Yes [provider]  albuterol (PROVENTIL) (2.5 MG/3ML) 0.083% nebulizer solution Take 2.5 mg by nebulization every 6 (six) hours as needed for wheezing or shortness of breath.   Yes [provider]  amLODipine (NORVASC) 2.5 MG tablet Take 2.5 mg by mouth daily. 04/06/23  Yes [provider]  aspirin 325 MG tablet Take 325 mg by mouth daily.   Yes [provider]  budesonide-formoterol (SYMBICORT) 160-4.5 MCG/ACT inhaler Inhale 2 puffs into the lungs 2 (two) times daily.   Yes [provider]  fexofenadine (ALLEGRA) 180 MG tablet Take by mouth. 08/05/18  Yes [provider]  ibuprofen (ADVIL) 600 MG tablet Take 1 tablet (600 mg total) by mouth every 8 (eight) hours as needed (pain). 06/07/23  Yes Zenia Resides, MD  ipratropium-albuterol (DUONEB) 0.5-2.5 (3) MG/3ML SOLN Take 3 mLs by nebulization.   Yes [provider]  lisinopril (ZESTRIL) 40 MG tablet Take by mouth. 08/05/18  Yes [provider]  LORazepam (ATIVAN) 0.5 MG tablet 1-2 tabs 30 - 60 min prior to MRI. Do not drive with this medicine. 07/14/22  Yes Rodolph Bong, MD  MAGNESIUM-OXIDE 400 (240 Mg) MG tablet Take 1 tablet by mouth daily. 05/03/23  Yes [provider]  Multiple Vitamin (MULTIVITAMIN PO) Take by mouth.   Yes [provider]  nitrofurantoin, macrocrystal-monohydrate, (MACROBID) 100 MG capsule Take 1 capsule (100 mg total) by mouth 2 (two) times daily. 06/07/23  Yes Zenia Resides, MD  POTASSIUM PO Take by mouth.   Yes [provider]  sertraline (ZOLOFT) 25 MG tablet Take 25 mg by mouth daily. 04/06/23  Yes [provider]  tiZANidine (ZANAFLEX) 4 MG tablet Take 1 tablet (4 mg total) by mouth every 8 (eight) hours as needed for muscle spasms. 06/07/23  Yes Cylan Borum, Janace Aris, MD  traMADol (ULTRAM) 50 MG tablet Take 1 tablet (50 mg total) by mouth  every 12 (twelve) hours as needed. 05/09/23  Yes Rodolph Bong, MD  TURMERIC PO Take by mouth.   Yes [provider]  acetaminophen (TYLENOL) 500 MG tablet Take 1 tablet (500 mg total) by mouth every 6 (six) hours as needed. 04/23/22   Carlisle Beers, FNP  Chlorpheniramine-Phenylephrine 4-10 MG per tablet Take 1 tablet by mouth every 4 (four) hours as needed for congestion.    [provider]  guaiFENesin (MUCINEX) 600 MG 12 hr tablet Take 600 mg by mouth 2 (two) times daily.    [provider]  MAGNESIUM PO Take by mouth.    [provider]  OVER THE COUNTER MEDICATION Apply 1 drop to eye 2 (two) times daily as needed (OTC Allergy relief).     [provider]    Family History History reviewed. No pertinent family history.  Social History Social History   Tobacco Use   Smoking status: Never   Smokeless tobacco: Never  Vaping Use   Vaping Use: Never used  Substance Use Topics   Alcohol use: Yes    Alcohol/week: 1.0 standard drink of alcohol    Types: 1 Glasses of wine per week   Drug use: No     Allergies   Patient has no known allergies.   Review of Systems Review of Systems  Musculoskeletal:  Positive for back pain.     Physical Exam Triage Vital Signs ED Triage Vitals [06/07/23 0918]  Enc Vitals Group     BP (!) 157/90     Pulse Rate 85     Resp 18     Temp 98.6 F (37 C)     Temp Source Oral     SpO2 97 %     Weight      Height      Head Circumference      Peak Flow      Pain Score      Pain Loc      Pain Edu?      Excl. in GC?    No data found.  Updated Vital Signs BP (!) 157/90 (BP Location: Left Arm)   Pulse 85   Temp 98.6 F (37 C) (Oral)   Resp 18   SpO2 97%   Visual Acuity Right Eye Distance:   Left Eye Distance:   Bilateral Distance:    Right Eye Near:   Left Eye Near:    Bilateral Near:     Physical Exam Vitals reviewed.  Constitutional:      Appearance: She is not ill-appearing, toxic-appearing or diaphoretic.     Comments: NARD. She is rocking forward and back while talking to me--?anxious  HENT:     Nose: Nose normal.     Mouth/Throat:     Mouth: Mucous membranes are moist.  Eyes:     Extraocular Movements: Extraocular movements intact.     Conjunctiva/sclera: Conjunctivae normal.     Pupils: Pupils are equal, round, and reactive to light.  Cardiovascular:     Rate and Rhythm: Normal rate and regular rhythm.     Heart sounds: No murmur heard. Pulmonary:     Effort: Pulmonary effort is normal.     Breath sounds: Normal breath sounds.  Abdominal:     General: There is no distension.     Palpations: Abdomen is soft.     Tenderness: There is no  abdominal tenderness. There is no right CVA tenderness, left CVA tenderness or guarding.  Musculoskeletal:  Cervical back: Neck supple.  Lymphadenopathy:     Cervical: No cervical adenopathy.  Skin:    Capillary Refill: Capillary refill takes less than 2 seconds.     Coloration: Skin is not jaundiced or pale.  Neurological:     General: No focal deficit present.     Mental Status: She is alert and oriented to person, place, and time.  Psychiatric:        Behavior: Behavior normal.      UC Treatments / Results  Labs (all labs ordered are listed, but only abnormal results are displayed) Labs Reviewed  POCT URINALYSIS DIP (MANUAL ENTRY) - Abnormal; Notable for the following components:      Result Value   Bilirubin, UA small (*)    Spec Grav, UA >=1.030 (*)    Leukocytes, UA Trace (*)    All other components within normal limits  URINE CULTURE    EKG   Radiology No results found.  Procedures Procedures (including critical care time)  Medications Ordered in UC Medications - No data to display  Initial Impression / Assessment and Plan / UC Course  I have reviewed the triage vital signs and the nursing notes.  Pertinent labs & imaging results that were available during my care of the patient were reviewed by me and considered in my medical decision making (see chart for details).        Urinalysis has a trace of white cells.  Urine culture is sent.  Since she has some lower abdominal discomfort and may be urinary hesitancy, Macrodantin is sent in for possible UTI.  I have asked her to take her tramadol at a time or 2 for this pain.  Prescription strength ibuprofen and the muscle relaxer sent in.  Since there is no recent trauma, I am not going to order an x-ray. Final Clinical Impressions(s) / UC Diagnoses   Final diagnoses:  Flank pain  Acute right-sided low back pain without sciatica  Urinary hesitancy     Discharge Instructions      Urinalysis  shows trace of white cells, and this could be a sign of a urine infection.  It would be okay to take your tramadol at a time or 2 for this back pain.  Take nitrofurantoin 100 mg--1 capsule 2 times daily for 5 days; this is an antibiotic for possible urine infection   Take ibuprofen 600 mg--1 tab every 8 hours as needed for pain.  Take tizanidine 4 mg--1 every 8 hours as needed for muscle spasms; this medication can cause dizziness and sleepiness       ED Prescriptions     Medication Sig Dispense Auth. Provider   nitrofurantoin, macrocrystal-monohydrate, (MACROBID) 100 MG capsule Take 1 capsule (100 mg total) by mouth 2 (two) times daily. 10 capsule Zenia Resides, MD   ibuprofen (ADVIL) 600 MG tablet Take 1 tablet (600 mg total) by mouth every 8 (eight) hours as needed (pain). 15 tablet Catelin Manthe, Janace Aris, MD   tiZANidine (ZANAFLEX) 4 MG tablet Take 1 tablet (4 mg total) by mouth every 8 (eight) hours as needed for muscle spasms. 10 tablet Marlinda Mike Janace Aris, MD      I have reviewed the PDMP during this encounter.   Zenia Resides, MD 06/07/23 [phone]

## 2023-06-07 NOTE — ED Triage Notes (Addendum)
Pt reports right side pain x 2 days.Pt reports dysuria x 2 days. \ Pt denies any falls or trauma.

## 2023-06-07 NOTE — Discharge Instructions (Signed)
Urinalysis shows trace of white cells, and this could be a sign of a urine infection.  It would be okay to take your tramadol at a time or 2 for this back pain.  Take nitrofurantoin 100 mg--1 capsule 2 times daily for 5 days; this is an antibiotic for possible urine infection   Take ibuprofen 600 mg--1 tab every 8 hours as needed for pain.  Take tizanidine 4 mg--1 every 8 hours as needed for muscle spasms; this medication can cause dizziness and sleepiness

## 2023-06-08 LAB — URINE CULTURE

## 2023-06-09 LAB — URINE CULTURE

## 2023-06-11 DIAGNOSIS — M545 Low back pain, unspecified: Secondary | ICD-10-CM | POA: Diagnosis not present

## 2023-06-11 DIAGNOSIS — R300 Dysuria: Secondary | ICD-10-CM | POA: Diagnosis not present

## 2023-06-11 DIAGNOSIS — M25551 Pain in right hip: Secondary | ICD-10-CM | POA: Diagnosis not present

## 2023-06-11 DIAGNOSIS — R350 Frequency of micturition: Secondary | ICD-10-CM | POA: Diagnosis not present
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
## 2023-06-15 ENCOUNTER — Ambulatory Visit: Payer: Medicaid Other | Attending: Family Medicine | Admitting: Physical Therapy

## 2023-06-15 ENCOUNTER — Ambulatory Visit (INDEPENDENT_AMBULATORY_CARE_PROVIDER_SITE_OTHER): Payer: Medicaid Other

## 2023-06-15 ENCOUNTER — Encounter: Payer: Self-pay | Admitting: Physical Therapy

## 2023-06-15 ENCOUNTER — Other Ambulatory Visit: Payer: Self-pay

## 2023-06-15 ENCOUNTER — Encounter: Payer: Self-pay | Admitting: Family Medicine

## 2023-06-15 ENCOUNTER — Ambulatory Visit: Payer: Medicaid Other | Admitting: Family Medicine

## 2023-06-15 VITALS — BP 122/80 | HR 102 | Ht 66.0 in | Wt 152.8 lb

## 2023-06-15 DIAGNOSIS — M25611 Stiffness of right shoulder, not elsewhere classified: Secondary | ICD-10-CM | POA: Diagnosis present

## 2023-06-15 DIAGNOSIS — G8929 Other chronic pain: Secondary | ICD-10-CM

## 2023-06-15 DIAGNOSIS — R293 Abnormal posture: Secondary | ICD-10-CM | POA: Insufficient documentation

## 2023-06-15 DIAGNOSIS — M25511 Pain in right shoulder: Secondary | ICD-10-CM | POA: Insufficient documentation

## 2023-06-15 DIAGNOSIS — M62838 Other muscle spasm: Secondary | ICD-10-CM | POA: Insufficient documentation

## 2023-06-15 DIAGNOSIS — R29810 Facial weakness: Secondary | ICD-10-CM | POA: Insufficient documentation

## 2023-06-15 DIAGNOSIS — M25551 Pain in right hip: Secondary | ICD-10-CM

## 2023-06-15 DIAGNOSIS — M6281 Muscle weakness (generalized): Secondary | ICD-10-CM | POA: Diagnosis present

## 2023-06-15 DIAGNOSIS — R262 Difficulty in walking, not elsewhere classified: Secondary | ICD-10-CM | POA: Insufficient documentation

## 2023-06-15 NOTE — Progress Notes (Signed)
Sophia Payor, PhD, LAT, ATC acting as a scribe for Sophia Graham, MD.  Sophia Salazar is a 62 y.o. female who presents to [facility] at [facility] today for f/u R shoulder pain. Pt was last seen by Dr. Denyse Amass on 05/09/23 and was given a R subacromial steroid injection and was referred to PT, 1 visit earlier today. Her tramadol was also refilled.  Today patient reports continued right shoulder pain. Also notes right hip pain. Had XR with [facility] but has not heard the results yet. Was treated for UTI but sx have not resolved. Notes pain and swelling at the lateral aspect of the hip. No known injury. Denies lower back pain. Some pain wrapping around into the groin/lower abd. Denies mechanical sx. Had first visit with PT this morning. Notes some relief of shoulder sx following the last injection, sx have started to worsen over the past 1-2 weeks.   Today, pt reports continued shoulder pain and new right lateral and posterior hip pain.  Dx imaging: 06/16/22 R shoulder XR   Pertinent review of systems: No fevers or chills  Relevant historical information: Anxiety and hypertension. History pseudoarthrosis or fibrous union clavicle fracture in the past.  Exam:  BP 122/80   Pulse (!) 102   Ht 5\' 6"  (1.676 m)   Wt 152 lb 12.8 oz (69.3 kg)   SpO2 98%   BMI 24.66 kg/m  General: Well Developed, well nourished, and in no acute distress.   MSK: Right shoulder decreased range of motion pain with abduction.  Right hip: Normal-appearing Tender palpation greater trochanter. Hip abduction and external rotation strength is reduced 4/5. Normal range of motion.    Lab and Radiology Results  X-ray images right hip obtained today personally and independently interpreted SI joint DJD is present.  No significant hip arthritis visible.  No acute fractures are present. Await formal radiology review   EXAM: LUMBAR SPINE - 3 VIEW  COMPARISON: None Available.  FINDINGS: Mild  S-shaped curvature of the thoracolumbar spine. No significant listhesis. Vertebral body heights are maintained. Multilevel disc height loss, which is most pronounced at L5-S1. Moderate degenerative changes at L2-L3, with milder degenerative changes at L3-L4 and L4-L5. Lower lumbar facet arthropathy. No definite stone is seen in the expected location of the kidneys. Nonobstructive bowel gas pattern.  IMPRESSION: Multilevel degenerative changes of the lumbar spine as above. If there is persistent concern for a kidney stone, a CT abdomen pelvis without contrast could be obtained.   Electronically Signed By: Wiliam Ke M.D. On: 06/14/2023 12:07  I, Sophia Salazar, personally (independently) visualized and performed the interpretation of the images attached in this note.   Assessment and Plan: 62 y.o. female with right lateral hip pain thought to be enteric bursitis and hip abductor tendinopathy.  Some of the pain may be originating from her lumbar spine.  At the lateral hip pain onto the physical therapy order.  Shoulder pain just getting started with physical therapy.  Plan to continue PT.  Reassess in about 6 weeks.   PDMP not reviewed this encounter. Orders Placed This Encounter  Procedures   DG HIP UNILAT WITH PELVIS 2-3 VIEWS RIGHT    Standing Status:   Future    Number of Occurrences:   1    Standing Expiration Date:   06/14/2024    Order Specific Question:   Reason for Exam (SYMPTOM  OR DIAGNOSIS REQUIRED)    Answer:   eval hip pain    Order  Specific Question:   Is patient pregnant?    Answer:   No    Order Specific Question:   Preferred imaging location?    Answer:   [facility]   Ambulatory referral to Physical Therapy    Referral Priority:   Routine    Referral Type:   Physical Medicine    Referral Reason:   Specialty Services Required    Requested Specialty:   Physical Therapy    Number of Visits Requested:   1   No orders of the defined types were placed in  this encounter.    Discussed warning signs or symptoms. Please see discharge instructions. Patient expresses understanding.   The above documentation has been reviewed and is accurate and complete Sophia Salazar, M.D.

## 2023-06-15 NOTE — Patient Instructions (Signed)
Thank you for coming in today.   Please get an Xray today before you leave   I've referred you to Physical Therapy.  Let us know if you don't hear from them in one week.   Recheck in 6 weeks   Let me know sooner if this is not working.

## 2023-06-18 ENCOUNTER — Ambulatory Visit: Payer: Medicaid Other | Admitting: Physical Therapy

## 2023-06-18 ENCOUNTER — Encounter: Payer: Self-pay | Admitting: Physical Therapy

## 2023-06-18 DIAGNOSIS — M62838 Other muscle spasm: Secondary | ICD-10-CM

## 2023-06-18 DIAGNOSIS — R293 Abnormal posture: Secondary | ICD-10-CM

## 2023-06-18 DIAGNOSIS — M25551 Pain in right hip: Secondary | ICD-10-CM

## 2023-06-18 DIAGNOSIS — M25511 Pain in right shoulder: Secondary | ICD-10-CM | POA: Diagnosis not present

## 2023-06-18 DIAGNOSIS — R262 Difficulty in walking, not elsewhere classified: Secondary | ICD-10-CM

## 2023-06-18 DIAGNOSIS — M6281 Muscle weakness (generalized): Secondary | ICD-10-CM

## 2023-06-18 DIAGNOSIS — G8929 Other chronic pain: Secondary | ICD-10-CM

## 2023-06-18 DIAGNOSIS — M25611 Stiffness of right shoulder, not elsewhere classified: Secondary | ICD-10-CM

## 2023-06-18 NOTE — Therapy (Signed)
OUTPATIENT PHYSICAL THERAPY RE-EVALUATION and TREATMENT   Patient Name: Sophia Salazar MRN: [id number] DOB:1961-02-17, 62 y.o., female Today's Date: 06/18/2023   END OF SESSION:  PT End of Session - 06/18/23 0931     Visit Number 2    Date for PT Re-Evaluation 08/10/23    Authorization Type [employer]    PT Start Time [phone]    PT Stop Time 1022    PT Time Calculation (min) 51 min    Activity Tolerance Patient tolerated treatment well    Behavior During Therapy [facility] for tasks assessed/performed;Anxious;Agitated             Past Medical History:  Diagnosis Date   Arthritis    Asthma    Stroke [facility])    1994?   Past Surgical History:  Procedure Laterality Date   COSMETIC SURGERY     on R arm   TUBAL LIGATION     There are no problems to display for this patient.   PCP: [facility]   REFERRING PROVIDER: Rodolph Bong, MD   REFERRING DIAG:  [id number] (ICD-10-CM) - Chronic right shoulder pain  M25.551 (ICD-10-CM) - Right hip pain   THERAPY DIAG:  Chronic right shoulder pain  Stiffness of right shoulder, not elsewhere classified  Abnormal posture  Muscle weakness (generalized)  Other muscle spasm  Pain in right hip  Difficulty in walking, not elsewhere classified  RATIONALE FOR EVALUATION AND TREATMENT: Rehabilitation  ONSET DATE: 1.5 yrs - getting progressively worse  NEXT MD VISIT: Today 06/15/23 at 11:30   SUBJECTIVE:                                                                                                                                                                                                         SUBJECTIVE STATEMENT: Pt reports she took a Goody powder before she came. During initial warm-up for her shoulder, she reports the MS added a new referral for R hip pain. She reports ~2 weeks ago she woke with new extreme stabbing pain in her R hip unlike her normal pain. She didn't think anything of it, but  then pain increased to the point where she could barely walk. Went to urgent care and was treated for UTI but still having hip pain but not as severe.  PAIN: Are you having pain? Yes: NPRS scale: 1/10 Pain location: R anterior shoulder over clavicle with radicular pain, numbness and tingling down R UE to elbow Pain description: soreness, stiffness, stabbing, tingling, ache, severe discomfort - feels like something is  not connected right Aggravating factors: vacuuming, lifting laundry basket, washing/brushing hair Relieving factors: ice packs, Voltaren gel, Tylenol, when pain is severe - Tramadol  Are you having pain? 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 Yes. Number of falls 1 - tripped over dog, catching herself with her R arm (but does not think that this aggravated her R UE pain)  LIVING ENVIRONMENT: Lives with: lives with their spouse Lives in: Mobile home Stairs: Yes: External: 2-3 steps; on left going up Has following equipment at home: None  OCCUPATION: unemployed - has hearing pending next Friday for disability   PLOF: Independent with household mobility without device, Independent with community mobility without device, Independent with gait, Needs assistance with ADLs, Needs assistance with homemaking, and Leisure: taking grandchildren to the pool  PATIENT GOALS: "To use my R arm so I could work. Decrease pain."   OBJECTIVE: (objective measures  completed at initial evaluation unless otherwise dated)  DIAGNOSTIC FINDINGS:  06/15/23 - R hip x-ray: X-ray images right hip obtained today personally and independently interpreted by Dr. Denyse Amass - SI joint DJD is present.  No significant hip arthritis visible.  No acute fractures are present. Await formal radiology review as of 06/18/23.  08/05/18 - Sacrum & coccyx x-ray: FINDINGS:  Pubic symphysis and rami are intact. No definite acute displaced fracture or malalignment. SI joints are non widened.  IMPRESSION:  No acute osseous abnormality.   08/05/18 - Lumbar spine x-ray: FINDINGS:  Lumbar alignment is within normal limits. The vertebral body heights are maintained. Moderate degenerative changes at L2-L3 and L5-S1 with mild degenerative changes at L3-L4 and L4-L5.  IMPRESSION:  Degenerative changes of the lumbar spine. No acute osseous abnormality.   05/09/23 - R shoulder x-ray: IMPRESSION: 1. Deformation of the mid clavicle consistent with remote history of trauma/fracture. 2. Minimal degenerative changes in the glenohumeral joint. 3. Mild AC joint degenerative change.  05/09/23 - R shoulder [place]:  Ultrasound evaluation prior to injection reveals mild hypoechoic fluid tracking within biceps tendon sheath. Subscapularis tendon appears to be intact. Supraspinatus tendon thinned at distal tendon insertion could represent partial tear.  No full-thickness rotator cuff tear is visible. Moderate subacromial bursitis is present. Infraspinatus tendon is intact. AC joint degenerative with moderate effusion. Mid clavicle pseudoarthrosis on ultrasound has what looks to be a joint effusion.  PATIENT SURVEYS:  LEFS 22 / 80 = 27.5 % (06/18/23) and Quick Dash 79.5 / 100 = 79.5 %  COGNITION: Overall cognitive status: History of cognitive impairments - at baseline     SENSATION: WFL Intermittent numbness and tingling in R UE  POSTURE: rounded shoulders and forward head  UPPER EXTREMITY ROM:    Active ROM Right eval Left eval  Shoulder flexion 68 ! 166  Shoulder extension 33 ! 55  Shoulder abduction 78 ! 180  Shoulder adduction    Shoulder internal rotation FIR lateral buttock ! FIR WNL  Shoulder external rotation FER - unable  50 ! FER WNL 81  Elbow flexion    Elbow extension  Wrist flexion    Wrist extension    Wrist ulnar deviation    Wrist radial deviation    Wrist pronation    Wrist supination    (Blank rows = not tested, ! = Pain with motion of R shoulder)  UPPER EXTREMITY MMT:  MMT Right eval Left eval  Shoulder flexion 2+ 5  Shoulder extension 4- 4  Shoulder abduction 2+ 5  Shoulder adduction    Shoulder internal rotation 3+ 5  Shoulder external rotation 3+ 4+  Middle trapezius    Lower trapezius    Elbow flexion    Elbow extension    Wrist flexion    Wrist extension    Wrist ulnar deviation    Wrist radial deviation    Wrist pronation    Wrist supination    Grip strength (lbs)    (Blank rows = not tested)  SHOULDER SPECIAL TESTS: Impingement tests: Neer impingement test: positive   PALPATION:  Increased muscle tension and TTP noted over right clavicle, pec, deltoids, lateral periscapular muscles, and UT/LS. 06/18/23 - increased muscle tension and TTP over B lumbar paraspinals, R>L QL and glutes/piriformis; TTP over B SIJ and R>L greater trochanter   LUMBAR ROM: (06/18/23)  Active  AROM  eval  Flexion Hands to lower thighs ! - Pain in R hip/lumbar spine  Extension 25% limited ! - Pain in thoracolumbar spine  Right lateral flexion Hand to lateral knee  Left lateral flexion Hand to mid thigh ! - Pain in R hip  Right rotation [facility]  Left rotation WFL   (Blank rows = not tested, ! = pain)  MUSCLE LENGTH: (06/18/23) Hamstrings: mild tight B ITB: mod tight R>L Piriformis: mild/mod tight R>L Hip flexors: mild/mod tight R>L Quads: mild/mod tight R>L Heelcord: NT  LOWER EXTREMITY ROM: (06/18/23) WFL other than as limited by above  tightness  LOWER EXTREMITY MMT:    MMT Right 06/18/23 Left 06/18/23  Hip flexion 3+ 4-  Hip extension 4- 4  Hip abduction 4- 4  Hip adduction 3+ 4  Hip internal rotation 4- 4+  Hip external rotation 3 3+  Knee flexion 4- 4  Knee extension 4 4+  Ankle dorsiflexion 4 4  Ankle plantarflexion 4 (9 SLS HR) 4+ (18 SLS HR)  Ankle inversion    Ankle eversion     (Blank rows = not tested)   TODAY'S TREATMENT:   06/18/23 THERAPEUTIC EXERCISE: to improve flexibility, strength and mobility.  Demonstration, verbal and tactile cues throughout for technique.  Pulleys: 3' each flexion and scaption w/o increased pain  Re-Eval for R hip pain - refer to above objective findings LEFS: 22 / 80 = 27.5 %   06/15/23 Initial eval   PATIENT EDUCATION:  Education details: PT eval findings, anticipated POC, and postural awareness  Person educated: Patient Education method: Explanation Education comprehension: verbalized understanding  HOME EXERCISE PROGRAM: TBD   ASSESSMENT:  CLINICAL IMPRESSION: Eyana returns to physical therapy MD wrote a new order for PT to evaluate and treat her for acute R hip pain.  She reports onset of pain approximately 2 weeks ago without known MOI.  Pain exacerbated by sitting requiring her to bring a cushion with her for sitting during activities at church services. R hip pain also limiting sleeping position and walking tolerance.  Deficits related to R hip pain include abnormal muscle tension and TTP in B lumbar paraspinals, R>L QL and glutes/piriformis as well as TTP over B SIJ and R>L greater trochanter, decreased proximal  lower extremity flexibility, decreased lumbar ROM, and R>L LE weakness.  Stephan will benefit from skilled PT to address above deficits to improve mobility and activity tolerance with decreased pain interference, in addition to planned interventions for chronic R shoulder pain.  OBJECTIVE IMPAIRMENTS: decreased activity tolerance, decreased knowledge of  condition, decreased mobility, decreased ROM, decreased strength, increased fascial restrictions, impaired perceived functional ability, increased muscle spasms, impaired flexibility, improper body mechanics, postural dysfunction, and pain.   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   REHAB POTENTIAL: Good  CLINICAL DECISION MAKING: Evolving/moderate complexity  EVALUATION COMPLEXITY: Moderate   GOALS: Goals reviewed with patient? Yes  SHORT TERM GOALS: Target date: 07/13/2023  Patient will be independent with initial HEP to improve outcomes and carryover.  Baseline:  Goal status: INITIAL  2.  Patient will demonstrate improved R shoulder A/PROM to >/= 90 degrees without increased pain. Baseline: Refer to above UE ROM table Goal status: INITIAL   LONG TERM GOALS: Target date: 08/10/2023  Patient will be independent with ongoing/advanced HEP for self-management at home.  Baseline:  Goal status: INITIAL  2.  Patient will report 50-75% improvement in R shoulder pain to improve QOL.  Baseline: Pain up to 7-8/10 Goal status: INITIAL  3.  Patient to demonstrate improved upright posture with posterior shoulder girdle engaged to promote improved glenohumeral joint mobility. Baseline: Patient demonstrates forward head and rounded shoulders R>L Goal status: INITIAL  4.  Patient to improve R shoulder AROM to [facility] without pain provocation to allow for  increased ease of ADLs.  Baseline: Refer to above UE ROM table Goal status: INITIAL  5.  Patient will demonstrate improved R shoulder strength to >/= 4/5 for functional UE use. Baseline: Refer to above UE MMT table Goal status: INITIAL  6  Patient will report </= 69% on QuickDASH to demonstrate improved functional ability.  Baseline: 79.5 / 100 = 79.5 % Goal status: INITIAL  7.  Patient will report >/=31/80 on LEFS to demonstrate improved functional ability. Baseline: 22 / 80 = 27.5 % Goal status: INITIAL  8.  Patient will report at least 50-75% improvement in R hip pain to improve QOL. Baseline: 3/10 Goal status: INITIAL  9.  Patient will demonstrate improved B LE strength to >/= 4 to 4+/5 for improved stability and ease of mobility . Baseline: Refer to above LE MMT table Goal status: INITIAL  10.  Patient will be able to sit through church services without need for additional cushioning or limitation due to R hip pain.  Baseline: Patient requires use of additional cushioning to sit through church services. Goal status: INITIAL  11.  Patient will report no limitation with walking and/or climbing stairs due to right hip pain. Baseline: Extreme difficulty reported per LEFS Goal status: INITIAL   PLAN:  PT FREQUENCY: 2x/week  PT DURATION: 6-8 weeks  PLANNED INTERVENTIONS: Therapeutic exercises, Therapeutic activity, Neuromuscular re-education, Patient/Family education, Self Care, Joint mobilization, Aquatic Therapy, Dry Needling, Spinal mobilization, Cryotherapy, Moist heat, Taping, Ultrasound, Manual therapy, and Re-evaluation  PLAN FOR NEXT SESSION: Create initial HEP for gentle P/AAROM R  shoulder and postural correction/strengthening as well as lumbopelvic flexibility and strengthening   Marry Guan, PT 06/18/2023, 12:37 PM

## 2023-06-21 ENCOUNTER — Ambulatory Visit: Payer: Medicaid Other

## 2023-06-21 DIAGNOSIS — M25551 Pain in right hip: Secondary | ICD-10-CM

## 2023-06-21 DIAGNOSIS — M25511 Pain in right shoulder: Secondary | ICD-10-CM | POA: Diagnosis not present

## 2023-06-21 DIAGNOSIS — M6281 Muscle weakness (generalized): Secondary | ICD-10-CM

## 2023-06-21 DIAGNOSIS — R293 Abnormal posture: Secondary | ICD-10-CM

## 2023-06-21 DIAGNOSIS — G8929 Other chronic pain: Secondary | ICD-10-CM

## 2023-06-21 DIAGNOSIS — M62838 Other muscle spasm: Secondary | ICD-10-CM

## 2023-06-21 DIAGNOSIS — R262 Difficulty in walking, not elsewhere classified: Secondary | ICD-10-CM

## 2023-06-21 DIAGNOSIS — M25611 Stiffness of right shoulder, not elsewhere classified: Secondary | ICD-10-CM

## 2023-06-21 NOTE — Therapy (Signed)
OUTPATIENT PHYSICAL THERAPY TREATMENT   Patient Name: Sophia Salazar MRN: [id number] DOB:Jul 07, 1961, 62 y.o., female Today's Date: 06/21/2023   END OF SESSION:  PT End of Session - 06/21/23 0810     Visit Number 3    Date for PT Re-Evaluation 08/10/23    Authorization Type [employer]    PT Start Time 0803    PT Stop Time 0849    PT Time Calculation (min) 46 min    Activity Tolerance Patient tolerated treatment well    Behavior During Therapy [facility] for tasks assessed/performed;Anxious             Past Medical History:  Diagnosis Date   Arthritis    Asthma    Stroke [facility])    1994?   Past Surgical History:  Procedure Laterality Date   COSMETIC SURGERY     on R arm   TUBAL LIGATION     There are no problems to display for this patient.   PCP: [facility]   REFERRING PROVIDER: Rodolph Bong, MD   REFERRING DIAG:  [phone] (ICD-10-CM) - Chronic right shoulder pain  M25.551 (ICD-10-CM) - Right hip pain   THERAPY DIAG:  Chronic right shoulder pain  Stiffness of right shoulder, not elsewhere classified  Abnormal posture  Muscle weakness (generalized)  Other muscle spasm  Pain in right hip  Difficulty in walking, not elsewhere classified  RATIONALE FOR EVALUATION AND TREATMENT: Rehabilitation  ONSET DATE: 1.5 yrs - getting progressively worse  NEXT MD VISIT: Today 06/15/23 at 11:30   SUBJECTIVE:                                                                                                                                                                                                         SUBJECTIVE STATEMENT: Pt reports   PAIN: Are you having pain? Yes: NPRS scale: 7/10 Pain location: R anterior shoulder over clavicle with radicular pain, numbness and tingling down R UE to elbow Pain description: soreness, stiffness, stabbing, tingling, ache, severe discomfort - feels like something is not connected  right Aggravating factors: vacuuming, lifting laundry basket, washing/brushing hair Relieving factors: ice packs, Voltaren gel, Tylenol, when pain is severe - Tramadol  Are you having pain? 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 Yes. Number of falls 1 - tripped over dog, catching herself with her R arm (but does not think that this aggravated her R UE pain)  LIVING ENVIRONMENT: Lives with: lives with their spouse Lives in: Mobile home Stairs: Yes: External: 2-3 steps; on left going up Has following equipment at home: None  OCCUPATION: unemployed - has hearing pending next Friday for disability   PLOF: Independent with household mobility without device, Independent with community mobility without device, Independent with gait, Needs assistance with ADLs, Needs assistance with homemaking, and Leisure: taking grandchildren to the pool  PATIENT GOALS: "To use my R arm so I could work. Decrease pain."   OBJECTIVE: (objective measures completed at initial evaluation unless otherwise dated)  DIAGNOSTIC FINDINGS:  06/15/23 - R hip x-ray: X-ray images right hip obtained today personally and independently interpreted by Dr. Denyse Amass - SI joint DJD is present.  No significant hip arthritis visible.  No acute fractures are present. Await formal radiology review as of 06/18/23.  08/05/18 - Sacrum & coccyx x-ray: FINDINGS:  Pubic symphysis and rami are intact. No definite  acute displaced fracture or malalignment. SI joints are non widened.  IMPRESSION:  No acute osseous abnormality.   08/05/18 - Lumbar spine x-ray: FINDINGS:  Lumbar alignment is within normal limits. The vertebral body heights are maintained. Moderate degenerative changes at L2-L3 and L5-S1 with mild degenerative changes at L3-L4 and L4-L5.  IMPRESSION:  Degenerative changes of the lumbar spine. No acute osseous abnormality.   05/09/23 - R shoulder x-ray: IMPRESSION: 1. Deformation of the mid clavicle consistent with remote history of trauma/fracture. 2. Minimal degenerative changes in the glenohumeral joint. 3. Mild AC joint degenerative change.  05/09/23 - R shoulder [place]:  Ultrasound evaluation prior to injection reveals mild hypoechoic fluid tracking within biceps tendon sheath. Subscapularis tendon appears to be intact. Supraspinatus tendon thinned at distal tendon insertion could represent partial tear.  No full-thickness rotator cuff tear is visible. Moderate subacromial bursitis is present. Infraspinatus tendon is intact. AC joint degenerative with moderate effusion. Mid clavicle pseudoarthrosis on ultrasound has what looks to be a joint effusion.  PATIENT SURVEYS:  LEFS 22 / 80 = 27.5 % (06/18/23) and Quick Dash 79.5 / 100 = 79.5 %  COGNITION: Overall cognitive status: History of cognitive impairments - at baseline     SENSATION: WFL Intermittent numbness and tingling in R UE  POSTURE: rounded shoulders and forward head  UPPER EXTREMITY ROM:   Active ROM Right eval Left eval  Shoulder flexion 68 ! 166  Shoulder extension 33 ! 55  Shoulder abduction 78 ! 180  Shoulder adduction    Shoulder internal rotation FIR lateral buttock ! FIR WNL  Shoulder external rotation FER - unable  50 ! FER WNL 81  Elbow flexion    Elbow extension    Wrist flexion    Wrist extension    Wrist ulnar deviation    Wrist radial deviation    Wrist pronation    Wrist supination     (Blank rows = not tested, ! = Pain with motion of R shoulder)  UPPER EXTREMITY MMT:  MMT Right eval Left eval  Shoulder flexion 2+ 5  Shoulder extension 4- 4  Shoulder abduction 2+ 5  Shoulder adduction    Shoulder internal rotation 3+ 5  Shoulder external rotation 3+ 4+  Middle trapezius    Lower trapezius    Elbow flexion    Elbow extension    Wrist flexion    Wrist extension    Wrist ulnar deviation    Wrist radial deviation    Wrist pronation    Wrist supination    Grip strength (lbs)    (Blank rows = not tested)  SHOULDER SPECIAL TESTS: Impingement tests: Neer impingement test: positive   PALPATION:  Increased muscle tension and TTP noted over right clavicle, pec, deltoids, lateral periscapular muscles, and UT/LS. 06/18/23 - increased muscle tension and TTP over B lumbar paraspinals, R>L QL and glutes/piriformis; TTP over B SIJ and R>L greater trochanter   LUMBAR ROM: (06/18/23)  Active  AROM  eval  Flexion Hands to lower thighs ! - Pain in R hip/lumbar spine  Extension 25% limited ! - Pain in thoracolumbar spine  Right lateral flexion Hand to lateral knee  Left lateral flexion Hand to mid thigh ! - Pain in R hip  Right rotation [facility]  Left rotation WFL   (Blank rows = not tested, ! = pain)  MUSCLE LENGTH: (06/18/23) Hamstrings: mild tight B ITB: mod tight R>L Piriformis: mild/mod tight R>L Hip flexors: mild/mod tight R>L Quads: mild/mod tight R>L Heelcord: NT  LOWER EXTREMITY ROM: (06/18/23) WFL other than as limited by above tightness  LOWER EXTREMITY MMT:    MMT Right 06/18/23 Left 06/18/23  Hip flexion 3+ 4-  Hip extension 4- 4  Hip abduction 4- 4  Hip adduction 3+ 4  Hip internal rotation 4- 4+  Hip external rotation 3 3+  Knee flexion 4- 4  Knee extension 4 4+  Ankle dorsiflexion 4 4  Ankle plantarflexion 4 (9 SLS HR) 4+ (18 SLS HR)  Ankle inversion    Ankle eversion     (Blank rows = not tested)   TODAY'S TREATMENT:   06/21/23 THERAPEUTIC EXERCISE: to improve flexibility, strength and mobility.  Demonstration, verbal and tactile cues throughout for technique.  Pulleys: 3' each flexion and scaption w/o increased pain Seated R shoulder flexion slide with swiss ball 2x10 Seated R shoulder scaption slide with swiss ball 2x10  Supine AAROM shoulder flexion with cane x 10  Supine AAROM R shoulder scaption with cane x 10  Supine AAROM ER with cane x 10  Standing rows YTB 2x10 cues for scapular retraction  Wall slides R shoulder flexion x 10  06/18/23 THERAPEUTIC EXERCISE: to improve flexibility, strength and mobility.  Demonstration, verbal and tactile cues throughout for technique.  Pulleys: 3' each flexion and scaption w/o increased pain  Re-Eval for R hip pain - refer to above objective findings LEFS: 22 / 80 = 27.5 %   06/15/23 Initial eval   PATIENT EDUCATION:  Education details: initial HEP and postural awareness  Person educated: Patient Education method: Explanation, Demonstration, Tactile cues, Verbal cues, and Handouts Education comprehension: verbalized understanding, returned demonstration, verbal cues required, and tactile cues required  HOME EXERCISE PROGRAM: Access Code: [email or web address] URL: [email or web address] Date: 06/21/2023 Prepared by: Verta Ellen  Exercises - Shoulder Flexion Wall Slide with Towel  - 1 x daily - 7 x weekly - 3 sets - 10 reps - Standing Bilateral Low Shoulder Row with Anchored Resistance  - 1 x daily - 7 x weekly - 3 sets - 10 reps - Supine Shoulder Flexion with Dowel  - 1 x daily - 7 x weekly - 3 sets - 10 reps - Supine Shoulder Scaption with  Dowel  - 1 x daily - 7 x weekly - 3 sets - 10 reps - Supine Shoulder External Rotation in Scaption AAROM  - 1 x daily - 7 x weekly - 3 sets - 10 reps   ASSESSMENT:  CLINICAL IMPRESSION: Pt showed a fair tolerance of the exercises today. Able to add AAROM exercises however patient needing cues to avoid  pushing to painful ROM. Also reinforced the importance of scapular depression and retraction to facilitate correct posture. Pt showed good response and would continue to benefit from skilled therapy.   OBJECTIVE IMPAIRMENTS: decreased activity tolerance, decreased knowledge of condition, decreased mobility, decreased ROM, decreased strength, increased fascial restrictions, impaired perceived functional ability, increased muscle spasms, impaired flexibility, improper body mechanics, postural dysfunction, and pain.   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   REHAB POTENTIAL: Good  CLINICAL DECISION MAKING: Evolving/moderate complexity  EVALUATION COMPLEXITY: Moderate   GOALS: Goals reviewed with patient? Yes  SHORT TERM GOALS: Target date: 07/13/2023  Patient will be independent with initial HEP to improve outcomes and carryover.  Baseline:  Goal status: IN PROGRESS  2.  Patient will demonstrate improved R shoulder A/PROM to >/= 90 degrees without increased pain. Baseline: Refer to above UE ROM table Goal status: IN PROGRESS   LONG TERM GOALS: Target date: 08/10/2023  Patient will be independent with ongoing/advanced HEP for self-management at home.  Baseline:  Goal status: IN PROGRESS  2.  Patient will report 50-75% improvement in R shoulder pain to improve QOL.  Baseline: Pain up to 7-8/10 Goal status: IN PROGRESS  3.  Patient to  demonstrate improved upright posture with posterior shoulder girdle engaged to promote improved glenohumeral joint mobility. Baseline: Patient demonstrates forward head and rounded shoulders R>L Goal status: IN PROGRESS  4.  Patient to improve R shoulder AROM to [facility] without pain provocation to allow for increased ease of ADLs.  Baseline: Refer to above UE ROM table Goal status: IN PROGRESS  5.  Patient will demonstrate improved R shoulder strength to >/= 4/5 for functional UE use. Baseline: Refer to above UE MMT table Goal status: IN PROGRESS  6  Patient will report </= 69% on QuickDASH to demonstrate improved functional ability.  Baseline: 79.5 / 100 = 79.5 % Goal status: IN PROGRESS  7.  Patient will report >/=31/80 on LEFS to demonstrate improved functional ability. Baseline: 22 / 80 = 27.5 % Goal status: IN PROGRESS  8.  Patient will report at least 50-75% improvement in R hip pain to improve QOL. Baseline: 3/10 Goal status: IN PROGRESS  9.  Patient will demonstrate improved B LE strength to >/= 4 to 4+/5 for improved stability and ease of mobility . Baseline: Refer to above LE MMT table Goal status: IN PROGRESS  10.  Patient will be able to sit through church services without need for additional cushioning or limitation due to R hip pain.  Baseline: Patient requires use of additional cushioning to sit through church services. Goal status: IN PROGRESS  11.  Patient will report no limitation with walking and/or climbing stairs due to  right hip pain. Baseline: Extreme difficulty reported per LEFS Goal status: IN PROGRESS   PLAN:  PT FREQUENCY: 2x/week  PT DURATION: 6-8 weeks  PLANNED INTERVENTIONS: Therapeutic exercises, Therapeutic activity, Neuromuscular re-education, Patient/Family education, Self Care, Joint mobilization, Aquatic Therapy, Dry Needling, Spinal mobilization, Cryotherapy, Moist heat, Taping, Ultrasound, Manual therapy, and Re-evaluation  PLAN FOR NEXT  SESSION:  gentle P/AAROM R shoulder and postural correction/strengthening as well as lumbopelvic flexibility and strengthening   Darleene Cleaver, PTA 06/21/2023, 8:51 AM

## 2023-06-22 ENCOUNTER — Encounter: Payer: Medicaid Other | Admitting: Physical Therapy

## 2023-06-22 NOTE — Progress Notes (Signed)
X-ray of the right hip looks okay.  You do not have some arthritis in the right hip.  You do have a little bit of arthritis on the left and a little bit at the base of the spine.

## 2023-06-27 ENCOUNTER — Ambulatory Visit: Payer: Medicaid Other | Attending: Family Medicine | Admitting: Physical Therapy

## 2023-06-27 ENCOUNTER — Encounter: Payer: Self-pay | Admitting: Physical Therapy

## 2023-06-27 DIAGNOSIS — M25551 Pain in right hip: Secondary | ICD-10-CM | POA: Diagnosis not present

## 2023-06-27 DIAGNOSIS — M25611 Stiffness of right shoulder, not elsewhere classified: Secondary | ICD-10-CM | POA: Diagnosis not present

## 2023-06-27 DIAGNOSIS — R262 Difficulty in walking, not elsewhere classified: Secondary | ICD-10-CM

## 2023-06-27 DIAGNOSIS — M6281 Muscle weakness (generalized): Secondary | ICD-10-CM | POA: Diagnosis not present

## 2023-06-27 DIAGNOSIS — G8929 Other chronic pain: Secondary | ICD-10-CM

## 2023-06-27 DIAGNOSIS — M62838 Other muscle spasm: Secondary | ICD-10-CM

## 2023-06-27 DIAGNOSIS — M25511 Pain in right shoulder: Secondary | ICD-10-CM | POA: Insufficient documentation

## 2023-06-27 DIAGNOSIS — R293 Abnormal posture: Secondary | ICD-10-CM

## 2023-06-27 NOTE — Therapy (Signed)
OUTPATIENT PHYSICAL THERAPY TREATMENT   Patient Name: Sophia Salazar MRN: [id number] DOB:[birth year], 62 y.o., female Today's Date: 06/27/2023   END OF SESSION:  PT End of Session - 06/27/23 0803     Visit Number 4    Date for PT Re-Evaluation 08/10/23    Authorization Type [facility] Medicaid    Authorization Time Period 06/18/23 - 08/16/23    Authorization - Visit Number 3    Authorization - Number of Visits 7    PT Start Time 0803    PT Stop Time 0845    PT Time Calculation (min) 42 min    Activity Tolerance Patient tolerated treatment well    Behavior During Therapy [facility] for tasks assessed/performed;Anxious             Past Medical History:  Diagnosis Date   Arthritis    Asthma    Stroke [facility])    1994?   Past Surgical History:  Procedure Laterality Date   COSMETIC SURGERY     on R arm   TUBAL LIGATION     There are no problems to display for this patient.   PCP: [facility]   REFERRING PROVIDER: Rodolph Bong, MD   REFERRING DIAG:  [phone] (ICD-10-CM) - Chronic right shoulder pain  M25.551 (ICD-10-CM) - Right hip pain   THERAPY DIAG:  Chronic right shoulder pain  Stiffness of right shoulder, not elsewhere classified  Abnormal posture  Muscle weakness (generalized)  Other muscle spasm  Pain in right hip  Difficulty in walking, not elsewhere classified  RATIONALE FOR EVALUATION AND TREATMENT: Rehabilitation  ONSET DATE: 1.5 yrs - getting progressively worse  NEXT MD VISIT: TBD   SUBJECTIVE:                                                                                                                                                                                                         SUBJECTIVE STATEMENT: Pt reports HEP seems to be going okay.  PAIN: Are you having pain? Yes: NPRS scale:  5/10 Pain location: R anterior shoulder over clavicle with radicular pain, numbness and tingling down R UE to elbow Pain  description: soreness, stiffness, stabbing, tingling, ache, severe discomfort - feels like something is not connected right Aggravating factors: vacuuming, lifting laundry basket, washing/brushing hair Relieving factors: ice packs, Voltaren gel, Tylenol, when pain is severe - Tramadol  Are you having pain? 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 Yes. Number of falls 1 - tripped over dog, catching herself with her R arm (but does not think that this aggravated her R UE pain)  LIVING ENVIRONMENT: Lives with: lives with their spouse Lives in: Mobile home Stairs: Yes: External: 2-3 steps; on left going up Has following equipment at home: None  OCCUPATION: unemployed - has hearing pending next Friday for disability   PLOF: Independent with household mobility without device, Independent with community mobility without device, Independent with gait, Needs assistance with ADLs, Needs assistance with homemaking, and Leisure: taking grandchildren to the pool  PATIENT GOALS: "To use my R arm so I could work. Decrease pain."   OBJECTIVE: (objective measures completed at initial evaluation unless otherwise dated)  DIAGNOSTIC FINDINGS:  06/15/23 - R hip x-ray: X-ray images right hip obtained today personally and independently interpreted by Dr. Denyse Amass - SI joint DJD is present.  No significant hip arthritis visible.  No acute fractures are present. Await formal radiology  review as of 06/18/23.  08/05/18 - Sacrum & coccyx x-ray: FINDINGS:  Pubic symphysis and rami are intact. No definite acute displaced fracture or malalignment. SI joints are non widened.  IMPRESSION:  No acute osseous abnormality.   08/05/18 - Lumbar spine x-ray: FINDINGS:  Lumbar alignment is within normal limits. The vertebral body heights are maintained. Moderate degenerative changes at L2-L3 and L5-S1 with mild degenerative changes at L3-L4 and L4-L5.  IMPRESSION:  Degenerative changes of the lumbar spine. No acute osseous abnormality.   05/09/23 - R shoulder x-ray: IMPRESSION: 1. Deformation of the mid clavicle consistent with remote history of trauma/fracture. 2. Minimal degenerative changes in the glenohumeral joint. 3. Mild AC joint degenerative change.  05/09/23 - R shoulder [place]:  Ultrasound evaluation prior to injection reveals mild hypoechoic fluid tracking within biceps tendon sheath. Subscapularis tendon appears to be intact. Supraspinatus tendon thinned at distal tendon insertion could represent partial tear.  No full-thickness rotator cuff tear is visible. Moderate subacromial bursitis is present. Infraspinatus tendon is intact. AC joint degenerative with moderate effusion. Mid clavicle pseudoarthrosis on ultrasound has what looks to be a joint effusion.  PATIENT SURVEYS:  LEFS 22 / 80 = 27.5 % (06/18/23) and Quick Dash 79.5 / 100 = 79.5 %  COGNITION: Overall cognitive status: History of cognitive impairments - at baseline     SENSATION: WFL Intermittent numbness and tingling in R UE  POSTURE: rounded shoulders and forward head  UPPER EXTREMITY ROM:   Active ROM Right eval Left eval  Shoulder flexion 68 ! 166  Shoulder extension 33 ! 55  Shoulder abduction 78 ! 180  Shoulder adduction    Shoulder internal rotation FIR lateral buttock ! FIR WNL  Shoulder external rotation FER - unable  50 ! FER WNL 81  Elbow flexion    Elbow extension    Wrist flexion     Wrist extension    Wrist ulnar deviation    Wrist radial deviation    Wrist pronation    Wrist supination    (Blank rows = not tested, ! = Pain with motion of R shoulder)  UPPER EXTREMITY MMT:  MMT Right eval  Left eval  Shoulder flexion 2+ 5  Shoulder extension 4- 4  Shoulder abduction 2+ 5  Shoulder adduction    Shoulder internal rotation 3+ 5  Shoulder external rotation 3+ 4+  Middle trapezius    Lower trapezius    Elbow flexion    Elbow extension    Wrist flexion    Wrist extension    Wrist ulnar deviation    Wrist radial deviation    Wrist pronation    Wrist supination    Grip strength (lbs)    (Blank rows = not tested)  SHOULDER SPECIAL TESTS: Impingement tests: Neer impingement test: positive   PALPATION:  Increased muscle tension and TTP noted over right clavicle, pec, deltoids, lateral periscapular muscles, and UT/LS. 06/18/23 - increased muscle tension and TTP over B lumbar paraspinals, R>L QL and glutes/piriformis; TTP over B SIJ and R>L greater trochanter   LUMBAR ROM: (06/18/23)  Active  AROM  eval  Flexion Hands to lower thighs ! - Pain in R hip/lumbar spine  Extension 25% limited ! - Pain in thoracolumbar spine  Right lateral flexion Hand to lateral knee  Left lateral flexion Hand to mid thigh ! - Pain in R hip  Right rotation [employer] [facility] [employer]  Left rotation WFL   (Blank rows = not tested, ! = pain)  MUSCLE LENGTH: (06/18/23) Hamstrings: mild tight B ITB: mod tight R>L Piriformis: mild/mod tight R>L Hip flexors: mild/mod tight R>L Quads: mild/mod tight R>L Heelcord: NT  LOWER EXTREMITY ROM: (06/18/23) WFL other than as limited by above tightness  LOWER EXTREMITY MMT:    MMT Right 06/18/23 Left 06/18/23  Hip flexion 3+ 4-  Hip extension 4- 4  Hip abduction 4- 4  Hip adduction 3+ 4  Hip internal rotation 4- 4+  Hip external rotation 3 3+  Knee flexion 4- 4  Knee extension 4 4+  Ankle dorsiflexion 4 4  Ankle plantarflexion 4 (9 SLS HR) 4+ (18 SLS HR)   Ankle inversion    Ankle eversion     (Blank rows = not tested)   TODAY'S TREATMENT:   06/27/23 THERAPEUTIC EXERCISE: to improve flexibility, strength and mobility.  Demonstration, verbal and tactile cues throughout for technique.  UBE - L1.0 x 5 (4' fwd & 1' back - stopped due to increased pain with backwards) Wall slides R shoulder flexion x 10  Wall slides R shoulder scaption x 10 - pt noting tingling in R arm afterwards Standing YTB rows 10 x 3", 2 sets - cues for pacing, cues to avoid shoulder shrug and for scapular retraction avoiding excessive shoulder extension or upward rotation of elbows Supine AAROM shoulder flexion with cane 2 x 10 - 1 sets with hands parallel on cane, 1st with L hand guiding R from below Supine AAROM R shoulder scaption with cane x 10  Supine AAROM ER at ~45 ABD with cane & towel roll under upper arm x 10   06/21/23 THERAPEUTIC EXERCISE: to improve flexibility, strength and mobility.  Demonstration, verbal and tactile cues throughout for technique.  Pulleys: 3' each flexion and scaption w/o increased pain Seated R shoulder flexion slide with swiss ball 2x10 Seated R shoulder scaption slide with swiss ball 2x10  Supine AAROM shoulder flexion with cane x 10  Supine AAROM R shoulder scaption with cane x 10  Supine AAROM ER with cane x 10  Standing rows YTB 2x10 cues for scapular retraction  Wall slides R shoulder flexion x 10   06/18/23 THERAPEUTIC EXERCISE: to improve flexibility,  strength and mobility.  Demonstration, verbal and tactile cues throughout for technique.  Pulleys: 3' each flexion and scaption w/o increased pain  Re-Eval for R hip pain - refer to above objective findings LEFS: 22 / 80 = 27.5 %   PATIENT EDUCATION:  Education details: initial HEP and postural awareness  Person educated: Patient Education method: Explanation, Demonstration, Tactile cues, Verbal cues, and Handouts Education comprehension: verbalized understanding,  returned demonstration, verbal cues required, and tactile cues required  HOME EXERCISE PROGRAM: Access Code: [email or web address] URL: [email or web address] Date: 06/27/2023 Prepared by: Glenetta Hew  Exercises - Shoulder Flexion Wall Slide with Towel  - 1 x daily - 7 x weekly - 3 sets - 10 reps - Standing Bilateral Low Shoulder Row with Anchored Resistance  - 1 x daily - 7 x weekly - 3 sets - 10 reps - Supine Shoulder Flexion Overhead with Dowel (Mirrored)  - 1 x daily - 7 x weekly - 2 sets - 10 reps - 3 sec hold - Supine Shoulder Scaption with Dowel  - 1 x daily - 7 x weekly - 3 sets - 10 reps - Supine Shoulder External Rotation in Scaption AAROM  - 1 x daily - 7 x weekly - 3 sets - 10 reps   ASSESSMENT:  CLINICAL IMPRESSION: Lagina reports good compliance with HEP but upon review, she admits to not attempting the wand exercises. Frequent cues necessary during HEP review to avoid pushing exercises into/through painful ROM, with pt stating fear of disappointing PT. Explained need for feedback regarding tolerance to allow PT to modify exercises to reduce/eliminate pain. Wand shoulder flexion modified according with pt able to demonstrate better motion, although cues still necessary to avoid pushing into painful ROM.  She will require close monitoring during exercise progression to adjust exercises to reduce/avoid painful motion. Given increased time spent on shoulder HEP review, we were unable to initiate a HEP for her hip today, therefore will plan for this next visit.  OBJECTIVE IMPAIRMENTS: decreased activity tolerance, decreased knowledge of condition, decreased mobility, decreased ROM, decreased strength, increased fascial restrictions, impaired perceived functional ability, increased muscle spasms, impaired flexibility, improper body mechanics, postural dysfunction, and pain.   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   REHAB POTENTIAL: Good  CLINICAL DECISION MAKING: Evolving/moderate complexity  EVALUATION COMPLEXITY: Moderate   GOALS: Goals reviewed with patient? Yes  SHORT TERM GOALS: Target date: 07/13/2023  Patient will be independent with initial HEP to improve outcomes and carryover.  Baseline:  Goal status: IN PROGRESS  2.  Patient will demonstrate improved R shoulder A/PROM to >/= 90 degrees without increased pain. Baseline: Refer to above UE ROM table Goal status: IN PROGRESS   LONG TERM GOALS: Target date: 08/10/2023  Patient will be independent with ongoing/advanced HEP for self-management at home.  Baseline:  Goal status: IN PROGRESS  2.  Patient will report 50-75% improvement in R shoulder pain to improve QOL.  Baseline: Pain up to 7-8/10 Goal status: IN PROGRESS  3.  Patient to demonstrate improved upright posture with posterior shoulder girdle engaged to promote improved glenohumeral  joint mobility. Baseline: Patient demonstrates forward head and rounded shoulders R>L Goal status: IN PROGRESS  4.  Patient to improve R shoulder AROM to [id number] [facility] without pain provocation to allow for increased ease of ADLs.  Baseline: Refer to above UE ROM table Goal status: IN PROGRESS  5.  Patient will demonstrate improved R shoulder strength to >/= 4/5 for functional UE use. Baseline: Refer to above UE MMT table Goal status: IN PROGRESS  6  Patient will report </= 69% on QuickDASH to demonstrate improved functional  ability.  Baseline: 79.5 / 100 = 79.5 % Goal status: IN PROGRESS  7.  Patient will report >/=31/80 on LEFS to demonstrate improved functional ability. Baseline: 22 / 80 = 27.5 % Goal status: IN PROGRESS  8.  Patient will report at least 50-75% improvement in R hip pain to improve QOL. Baseline: 3/10 Goal status: IN PROGRESS  9.  Patient will demonstrate improved B LE strength to >/= 4 to 4+/5 for improved stability and ease of mobility . Baseline: Refer to above LE MMT table Goal status: IN PROGRESS  10.  Patient will be able to sit through church services without need for additional cushioning or limitation due to R hip pain.  Baseline: Patient requires use of additional cushioning to sit through church services. Goal status: IN PROGRESS  11.  Patient will report no limitation with walking and/or climbing stairs due to right hip pain. Baseline: Extreme difficulty reported per LEFS Goal status: IN PROGRESS   PLAN:  PT FREQUENCY: 2x/week  PT DURATION: 6-8 weeks  PLANNED INTERVENTIONS: Therapeutic exercises, Therapeutic activity, Neuromuscular re-education, Patient/Family education, Self Care, Joint mobilization, Aquatic Therapy, Dry Needling, Spinal mobilization, Cryotherapy, Moist heat, Taping, Ultrasound, Manual therapy, and Re-evaluation  PLAN FOR NEXT SESSION:  Create initial HEP for R hip pain; gentle P/AAROM R shoulder and postural correction/strengthening as well as lumbopelvic flexibility and strengthening   Marry Guan, PT 06/27/2023, 1:16 PM

## 2023-07-02 ENCOUNTER — Ambulatory Visit: Payer: Medicaid Other

## 2023-07-05 DIAGNOSIS — N3001 Acute cystitis with hematuria: Secondary | ICD-10-CM | POA: Diagnosis not present

## 2023-07-05 DIAGNOSIS — R300 Dysuria: Secondary | ICD-10-CM | POA: Diagnosis not present

## 2023-07-06 ENCOUNTER — Encounter: Payer: Self-pay | Admitting: Physical Therapy

## 2023-07-06 ENCOUNTER — Ambulatory Visit: Payer: Medicaid Other | Admitting: Physical Therapy

## 2023-07-06 DIAGNOSIS — M25551 Pain in right hip: Secondary | ICD-10-CM | POA: Diagnosis not present

## 2023-07-06 DIAGNOSIS — M25611 Stiffness of right shoulder, not elsewhere classified: Secondary | ICD-10-CM | POA: Diagnosis not present

## 2023-07-06 DIAGNOSIS — R293 Abnormal posture: Secondary | ICD-10-CM

## 2023-07-06 DIAGNOSIS — G8929 Other chronic pain: Secondary | ICD-10-CM

## 2023-07-06 DIAGNOSIS — M6281 Muscle weakness (generalized): Secondary | ICD-10-CM | POA: Diagnosis not present

## 2023-07-06 DIAGNOSIS — M62838 Other muscle spasm: Secondary | ICD-10-CM

## 2023-07-06 DIAGNOSIS — R262 Difficulty in walking, not elsewhere classified: Secondary | ICD-10-CM

## 2023-07-06 DIAGNOSIS — M25511 Pain in right shoulder: Secondary | ICD-10-CM | POA: Diagnosis not present

## 2023-07-06 NOTE — Therapy (Signed)
OUTPATIENT PHYSICAL THERAPY TREATMENT   Patient Name: Sophia Salazar MRN: [id number] DOB:[birth year], 62 y.o., female Today's Date: 07/06/2023   END OF SESSION:  PT End of Session - 07/06/23 1021     Visit Number 5    Date for PT Re-Evaluation 08/10/23    Authorization Type [facility] Medicaid    Authorization Time Period 06/18/23 - 08/16/23    Authorization - Visit Number 4    Authorization - Number of Visits 7    PT Start Time 1021    PT Stop Time 1107    PT Time Calculation (min) 46 min    Activity Tolerance Patient tolerated treatment well;Patient limited by pain    Behavior During Therapy [facility] for tasks assessed/performed;Anxious             Past Medical History:  Diagnosis Date   Arthritis    Asthma    Stroke [facility])    1994?   Past Surgical History:  Procedure Laterality Date   COSMETIC SURGERY     on R arm   TUBAL LIGATION     There are no problems to display for this patient.   PCP: [facility]   REFERRING PROVIDER: Rodolph Bong, MD   REFERRING DIAG:  [phone] (ICD-10-CM) - Chronic right shoulder pain  M25.551 (ICD-10-CM) - Right hip pain   THERAPY DIAG:  Chronic right shoulder pain  Stiffness of right shoulder, not elsewhere classified  Abnormal posture  Muscle weakness (generalized)  Other muscle spasm  Pain in right hip  Difficulty in walking, not elsewhere classified  RATIONALE FOR EVALUATION AND TREATMENT: Rehabilitation  ONSET DATE: 1.5 yrs - getting progressively worse  NEXT MD VISIT: TBD   SUBJECTIVE:                                                                                                                                                                                                         SUBJECTIVE STATEMENT: Pt feels like she is going to hurt for the rest of her life and feels like she just has to learn to deal with it. Pt reports she is looking for a job that will require her to use her  shoulder less. Pt reports she went to urgent care last night due to R side pain - still has a UTI and may have kidney stones - CT w/o contrast ordered.    PAIN: Are you having pain? Yes: NPRS scale:  0/10 Pain location: R anterior shoulder over clavicle with radicular pain, numbness and tingling down R UE to  elbow Pain description: soreness, stiffness, stabbing, tingling, ache, severe discomfort - feels like something is not connected right Aggravating factors: vacuuming, lifting laundry basket, washing/brushing hair Relieving factors: ice packs, Voltaren gel, Tylenol, when pain is severe - Tramadol  Are you having pain? 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 Yes. Number of falls 1 - tripped over dog, catching herself with her R arm (but does not think that this aggravated her R UE pain)  LIVING ENVIRONMENT: Lives with: lives with their spouse Lives in: Mobile home Stairs: Yes: External: 2-3 steps; on left going up Has following equipment at home: None  OCCUPATION: unemployed - has hearing pending next Friday for disability   PLOF: Independent with household mobility without device, Independent with community mobility without device, Independent with gait, Needs assistance with ADLs, Needs assistance with homemaking, and Leisure: taking grandchildren to the pool  PATIENT GOALS: "To use my R arm so I could work. Decrease pain."   OBJECTIVE: (objective  measures completed at initial evaluation unless otherwise dated)  DIAGNOSTIC FINDINGS:  06/15/23 - R hip x-ray: X-ray images right hip obtained today personally and independently interpreted by Dr. Denyse Amass - SI joint DJD is present.  No significant hip arthritis visible.  No acute fractures are present. FINDINGS: Right hip joint space is maintained. No degenerative changes. On the left, mild uniform loss of joint space with collar osteophytosis along the left femoral head. Incidentally imaged degenerative changes in the lower lumbar spine. IMPRESSION: 1. Normal right hip. 2. Mild left hip osteoarthritis. 3. Incidentally imaged degenerative changes in the lumbar spine.  08/05/18 - Sacrum & coccyx x-ray: FINDINGS:  Pubic symphysis and rami are intact. No definite acute displaced fracture or malalignment. SI joints are non widened.  IMPRESSION:  No acute osseous abnormality.   08/05/18 - Lumbar spine x-ray: FINDINGS:  Lumbar alignment is within normal limits. The vertebral body heights are maintained. Moderate degenerative changes at L2-L3 and L5-S1 with mild degenerative changes at L3-L4 and L4-L5.  IMPRESSION:  Degenerative changes of the lumbar spine. No acute osseous abnormality.   05/09/23 - R shoulder x-ray: IMPRESSION: 1. Deformation of the mid clavicle consistent with remote history of trauma/fracture. 2. Minimal degenerative changes in the glenohumeral joint. 3. Mild AC joint degenerative change.  05/09/23 - R shoulder [place]:  Ultrasound evaluation prior to injection reveals mild hypoechoic fluid tracking within biceps tendon sheath. Subscapularis tendon appears to be intact. Supraspinatus tendon thinned at distal tendon insertion could represent partial tear.  No full-thickness rotator cuff tear is visible. Moderate subacromial bursitis is present. Infraspinatus tendon is intact. AC joint degenerative with moderate effusion. Mid clavicle pseudoarthrosis on ultrasound has what looks  to be a joint effusion.  PATIENT SURVEYS:  LEFS 22 / 80 = 27.5 % (06/18/23) and Quick Dash 79.5 / 100 = 79.5 %  COGNITION: Overall cognitive status: History of cognitive impairments - at baseline     SENSATION: WFL Intermittent numbness and tingling in R UE  POSTURE: rounded shoulders and forward head  UPPER EXTREMITY ROM:  Active ROM Right eval Left eval R 07/06/23  Shoulder flexion 68 ! 166 144 !  Shoulder extension 33 ! 55 39 !  Shoulder abduction 78 ! 180 101 !  Shoulder adduction     Shoulder internal rotation FIR lateral buttock ! FIR WNL FIR mid buttock !  Shoulder external rotation FER - unable  50 ! FER WNL 81 FER T4 80 !  Elbow flexion     Elbow extension     Wrist flexion     Wrist extension     Wrist ulnar deviation     Wrist radial deviation     Wrist pronation     Wrist supination     (Blank rows = not tested, ! = Pain with motion of R shoulder)  UPPER EXTREMITY MMT:  MMT Right eval Left eval  Shoulder flexion 2+ 5  Shoulder extension 4- 4  Shoulder abduction 2+ 5  Shoulder adduction    Shoulder internal rotation 3+ 5  Shoulder external rotation 3+ 4+  Middle trapezius    Lower trapezius    Elbow flexion    Elbow extension    Wrist flexion    Wrist extension    Wrist ulnar deviation    Wrist radial deviation    Wrist pronation    Wrist supination    Grip strength (lbs)    (Blank rows = not tested)  SHOULDER SPECIAL TESTS: Impingement tests: Neer impingement test: positive   PALPATION:  Increased muscle tension and TTP noted over right clavicle, pec, deltoids, lateral periscapular muscles, and UT/LS. 06/18/23 - increased muscle tension and TTP over B lumbar paraspinals, R>L QL and glutes/piriformis; TTP over B SIJ and R>L greater trochanter   LUMBAR ROM: (06/18/23)  Active  AROM  eval  Flexion Hands to lower thighs ! - Pain in R hip/lumbar spine  Extension 25% limited ! - Pain in thoracolumbar spine  Right lateral flexion Hand to  lateral knee  Left lateral flexion Hand to mid thigh ! - Pain in R hip  Right rotation [facility]  Left rotation WFL   (Blank rows = not tested, ! = pain)  MUSCLE LENGTH: (06/18/23) Hamstrings: mild tight B ITB: mod tight R>L Piriformis: mild/mod tight R>L Hip flexors: mild/mod tight R>L Quads: mild/mod tight R>L Heelcord: NT  LOWER EXTREMITY ROM: (06/18/23) WFL other than as limited by above tightness  LOWER EXTREMITY MMT:    MMT Right 06/18/23 Left 06/18/23  Hip flexion 3+ 4-  Hip extension 4- 4  Hip abduction 4- 4  Hip adduction 3+ 4  Hip internal rotation 4- 4+  Hip external rotation 3 3+  Knee flexion 4- 4  Knee extension 4 4+  Ankle dorsiflexion 4 4  Ankle plantarflexion 4 (9 SLS HR) 4+ (18 SLS HR)  Ankle inversion    Ankle eversion     (Blank rows = not tested)   TODAY'S TREATMENT:   07/06/23 THERAPEUTIC EXERCISE: to improve flexibility, strength and mobility.  Demonstration, verbal and tactile cues throughout for technique.  Pulleys: 3' flexion and 1.5' scaption - no increased pain in flexion but more uncomfortable in scaption with weakness noted in lower arm NuStep: L3 x 3 min (UE/LE) Attempted R shoulder isometrics but pt unable to grade effort to level where not causing increased pain  Standing RTB rows 10 x 3", 2 sets - cues for pacing, cues to avoid shoulder shrug and for scapular retraction avoiding excessive shoulder extension or upward rotation of elbows Seated R QL stretch  with side lean x 30" - pt reporting this bothers her shoulder Seated lumbar flexion and L flexion/sidebending stretches with hands on cane for support 2 x 30" each Seated R hip hinge HS stretch 2 x 30" - repeated cues to avoid forcing painful ROM Seated fwd and side-sitting R figure-4 piriformis stretch x 30" - poor tolerance due to increased pain Hooklying R figure-4 piriformis stretch x 30" - better tolerance but repeated cues to avoid forcing painful ROM Hooklying R KTOS piriformis stretch x  30" - better tolerance but repeated cues to avoid forcing painful ROM  THERAPEUTIC ACTIVITIES: R shoulder ROM assessment   06/27/23 THERAPEUTIC EXERCISE: to improve flexibility, strength and mobility.  Demonstration, verbal and tactile cues throughout for technique.  UBE - L1.0 x 5 (4' fwd & 1' back - stopped due to increased pain with backwards) Wall slides R shoulder flexion x 10  Wall slides R shoulder scaption x 10 - pt noting tingling in R arm afterwards Standing YTB rows 10 x 3", 2 sets - cues for pacing, cues to avoid shoulder shrug and for scapular retraction avoiding excessive shoulder extension or upward rotation of elbows Supine AAROM shoulder flexion with cane 2 x 10 - 1 sets with hands parallel on cane, 1st with L hand guiding R from below Supine AAROM R shoulder scaption with cane x 10  Supine AAROM ER at ~45 ABD with cane & towel roll under upper arm x 10   06/21/23 THERAPEUTIC EXERCISE: to improve flexibility, strength and mobility.  Demonstration, verbal and tactile cues throughout for technique.  Pulleys: 3' each flexion and scaption w/o increased pain Seated R shoulder flexion slide with swiss ball 2x10 Seated R shoulder scaption slide with swiss ball 2x10  Supine AAROM shoulder flexion with cane x 10  Supine AAROM R shoulder scaption with cane x 10  Supine AAROM ER with cane x 10  Standing rows YTB 2x10 cues for scapular retraction  Wall slides R shoulder flexion x 10   PATIENT EDUCATION:  Education details: HEP update - lumbar/QL & hip stretches   Person educated: Patient Education method: Explanation, Demonstration, Tactile cues, Verbal cues, and Handouts Education comprehension: verbalized understanding, returned demonstration, verbal cues required, and tactile cues required  HOME EXERCISE PROGRAM: Access Code: [email or web address] URL: [email or web address] Date: 07/06/2023 Prepared by: Glenetta Hew  Exercises - Shoulder Flexion Wall Slide with Towel   - 1 x daily - 7 x weekly - 3 sets - 10 reps - Standing Bilateral Low Shoulder Row with Anchored Resistance  - 1 x daily - 7 x weekly - 3 sets - 10 reps - Supine Shoulder Flexion Overhead with Dowel (Mirrored)  - 1 x daily - 7 x weekly - 2 sets - 10 reps - 3 sec hold - Supine Shoulder Scaption with Dowel  - 1 x daily - 7 x weekly - 3 sets - 10 reps - Supine Shoulder External Rotation in Scaption AAROM  - 1 x daily - 7 x weekly - 3 sets - 10 reps - Seated Flexion Stretch with Swiss Ball  - 1 x daily - 7 x weekly - 3 reps - 30 sec hold - Seated Thoracic Flexion and Rotation with Swiss Ball  - 1 x daily - 7 x weekly - 3 reps - 30 sec hold - Supine Piriformis Stretch with Foot on Ground  - 1 x daily - 7 x weekly - 3 reps - 30 sec hold - Supine Figure 4 Piriformis Stretch  - 1  x daily - 7 x weekly - 3 reps - 30 sec hold   ASSESSMENT:  CLINICAL IMPRESSION: Breklyn was able to demonstrate improved R shoulder ROM in all planes today however with continued increased pain near end ROM - STG #2 met.  She continues to require repeated frequent cues to avoid pushing movements and exercises beyond 5-6/10 pain level.  Advanced resistance with shoulder rows to RTB however cues still necessary to avoid shoulder shrug/elevation during row.  Attempted to initiate shoulder isometric strengthening however patient unable to adjust force/effort levels to achieve muscle activation without increased pain.  As for her R hip pain, Liliane reports her pain today is more in her R flank in addition to the hip, with a visit to urgent care last night indicating ongoing UTI and potential for kidney stones, however patient TTP in glutes and QL.  Initiated instruction in stretches to address tight hip/low back muscles, but continued cueing necessary to avoid pushing into increased painful ROM - HEP updated to include best tolerated stretches with repeated instruction to avoid pain levels >/= 5-6/10.  Mariell will continue to benefit from skilled  PT to address ongoing pain, ROM, and strength deficits to improve mobility and activity tolerance with decreased pain interference.   OBJECTIVE IMPAIRMENTS: decreased activity tolerance, decreased knowledge of condition, decreased mobility, decreased ROM, decreased strength, increased fascial restrictions, impaired perceived functional ability, increased muscle spasms, impaired flexibility, improper body mechanics, postural dysfunction, and pain.   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   REHAB POTENTIAL: Good  CLINICAL DECISION MAKING: Evolving/moderate complexity  EVALUATION COMPLEXITY: Moderate   GOALS: Goals reviewed with patient? Yes  SHORT TERM GOALS: Target date: 07/13/2023  Patient will be independent with initial HEP to improve outcomes and carryover.  Baseline:  Goal status: MET  07/06/23  2.  Patient will demonstrate improved R shoulder A/PROM to >/= 90 degrees without increased pain. Baseline: Refer to above UE ROM table Goal status: MET  07/06/23    LONG TERM GOALS: Target date: 08/10/2023  Patient will be independent with ongoing/advanced HEP for self-management at home.  Baseline:  Goal status: IN PROGRESS  2.  Patient will report 50-75% improvement in R shoulder pain to improve QOL.  Baseline: Pain up to 7-8/10 Goal status: IN PROGRESS  3.  Patient to demonstrate improved upright posture with posterior shoulder girdle engaged  to promote improved glenohumeral joint mobility. Baseline: Patient demonstrates forward head and rounded shoulders R>L Goal status: IN PROGRESS  4.  Patient to improve R shoulder AROM to [facility] without pain provocation to allow for increased ease of ADLs.  Baseline: Refer to above UE ROM table Goal status: IN PROGRESS  5.  Patient will demonstrate improved R shoulder strength to >/= 4/5 for functional UE use. Baseline: Refer to above UE MMT table Goal status: IN PROGRESS  6  Patient will report </= 69% on QuickDASH to demonstrate improved functional ability.  Baseline: 79.5 / 100 = 79.5 % Goal status: IN PROGRESS  7.  Patient will report >/=31/80 on LEFS to demonstrate improved functional ability. Baseline: 22 /  80 = 27.5 % Goal status: IN PROGRESS  8.  Patient will report at least 50-75% improvement in R hip pain to improve QOL. Baseline: 3/10 Goal status: IN PROGRESS  9.  Patient will demonstrate improved B LE strength to >/= 4 to 4+/5 for improved stability and ease of mobility . Baseline: Refer to above LE MMT table Goal status: IN PROGRESS  10.  Patient will be able to sit through church services without need for additional cushioning or limitation due to R hip pain.  Baseline: Patient requires use of additional cushioning to sit through church services. Goal status: IN PROGRESS  11.  Patient will report no limitation with walking and/or climbing stairs due to right hip pain. Baseline: Extreme difficulty reported per LEFS Goal status: IN PROGRESS   PLAN:  PT FREQUENCY: 2x/week  PT DURATION: 6-8 weeks  PLANNED INTERVENTIONS: Therapeutic exercises, Therapeutic activity, Neuromuscular re-education, Patient/Family education, Self Care, Joint mobilization, Aquatic Therapy, Dry Needling, Spinal mobilization, Cryotherapy, Moist heat, Taping, Ultrasound, Manual therapy, and Re-evaluation  PLAN FOR NEXT SESSION: Progress HEP for R hip pain; gentle P/AAROM R shoulder and postural  correction/strengthening as well as lumbopelvic flexibility and strengthening   Marry Guan, PT 07/06/2023, 1:02 PM

## 2023-07-09 ENCOUNTER — Encounter: Payer: Self-pay | Admitting: Physical Therapy

## 2023-07-09 ENCOUNTER — Ambulatory Visit: Payer: Medicaid Other | Admitting: Physical Therapy

## 2023-07-09 DIAGNOSIS — M25551 Pain in right hip: Secondary | ICD-10-CM | POA: Diagnosis not present

## 2023-07-09 DIAGNOSIS — M62838 Other muscle spasm: Secondary | ICD-10-CM

## 2023-07-09 DIAGNOSIS — R293 Abnormal posture: Secondary | ICD-10-CM | POA: Diagnosis not present

## 2023-07-09 DIAGNOSIS — G8929 Other chronic pain: Secondary | ICD-10-CM | POA: Diagnosis not present

## 2023-07-09 DIAGNOSIS — M6281 Muscle weakness (generalized): Secondary | ICD-10-CM

## 2023-07-09 DIAGNOSIS — R262 Difficulty in walking, not elsewhere classified: Secondary | ICD-10-CM

## 2023-07-09 DIAGNOSIS — M25511 Pain in right shoulder: Secondary | ICD-10-CM | POA: Diagnosis not present

## 2023-07-09 DIAGNOSIS — M25611 Stiffness of right shoulder, not elsewhere classified: Secondary | ICD-10-CM

## 2023-07-09 NOTE — Therapy (Signed)
OUTPATIENT PHYSICAL THERAPY TREATMENT   Patient Name: Sophia Salazar MRN: [id number] DOB:1961/12/14, 62 y.o., female Today's Date: 07/09/2023   END OF SESSION:  PT End of Session - 07/09/23 0937     Visit Number 6    Date for PT Re-Evaluation 08/10/23    Authorization Type [facility] Medicaid    Authorization Time Period 06/18/23 - 08/16/23    Authorization - Visit Number 5    Authorization - Number of Visits 7    PT Start Time 0937    PT Stop Time 1015    PT Time Calculation (min) 38 min    Activity Tolerance Patient tolerated treatment well;Patient limited by pain             Past Medical History:  Diagnosis Date   Arthritis    Asthma    Stroke (HCC)    1994?   Past Surgical History:  Procedure Laterality Date   COSMETIC SURGERY     on R arm   TUBAL LIGATION     There are no problems to display for this patient.   PCP: [facility]   REFERRING PROVIDER: Rodolph Bong, MD   REFERRING DIAG:  [phone] (ICD-10-CM) - Chronic right shoulder pain  M25.551 (ICD-10-CM) - Right hip pain   THERAPY DIAG:  Chronic right shoulder pain  Stiffness of right shoulder, not elsewhere classified  Abnormal posture  Muscle weakness (generalized)  Other muscle spasm  Pain in right hip  Difficulty in walking, not elsewhere classified  RATIONALE FOR EVALUATION AND TREATMENT: Rehabilitation  ONSET DATE: 1.5 yrs - getting progressively worse  NEXT MD VISIT: TBD   SUBJECTIVE:                                                                                                                                                                                                         SUBJECTIVE STATEMENT:  Pain is worse in the shoulder in the evening than in the morning. Patient reports she has had a death in the family and has not done any of her HEP.   PAIN: Are you having pain? Yes: NPRS scale:  0/10 Pain location: R anterior shoulder over clavicle  with radicular pain, numbness and tingling down R UE to elbow Pain description: soreness, stiffness, stabbing, tingling, ache, severe discomfort - feels like something is not connected right Aggravating factors: vacuuming, lifting laundry basket, washing/brushing hair Relieving factors: ice packs, Voltaren gel, Tylenol, when pain is severe - Tramadol  Are you having pain? 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 Yes. Number of falls 1 - tripped over dog, catching herself with her R arm (but does not think that this aggravated her R UE pain)  LIVING ENVIRONMENT: Lives with: lives with their spouse Lives in: Mobile home Stairs: Yes: External: 2-3 steps; on left going up Has following equipment at home: None  OCCUPATION: unemployed - has hearing pending next Friday for disability   PLOF: Independent with household mobility without device, Independent with community mobility without device, Independent with gait, Needs assistance with ADLs, Needs assistance with homemaking, and Leisure: taking grandchildren to the pool  PATIENT GOALS: "To use my R arm so I could work. Decrease pain."   OBJECTIVE: (objective measures completed at initial evaluation unless otherwise dated)  DIAGNOSTIC FINDINGS:  06/15/23 - R hip x-ray: X-ray images right hip obtained today personally and independently interpreted by Dr. Denyse Amass - SI joint DJD is present.  No significant hip arthritis  visible.  No acute fractures are present. FINDINGS: Right hip joint space is maintained. No degenerative changes. On the left, mild uniform loss of joint space with collar osteophytosis along the left femoral head. Incidentally imaged degenerative changes in the lower lumbar spine. IMPRESSION: 1. Normal right hip. 2. Mild left hip osteoarthritis. 3. Incidentally imaged degenerative changes in the lumbar spine.  08/05/18 - Sacrum & coccyx x-ray: FINDINGS:  Pubic symphysis and rami are intact. No definite acute displaced fracture or malalignment. SI joints are non widened.  IMPRESSION:  No acute osseous abnormality.   08/05/18 - Lumbar spine x-ray: FINDINGS:  Lumbar alignment is within normal limits. The vertebral body heights are maintained. Moderate degenerative changes at L2-L3 and L5-S1 with mild degenerative changes at L3-L4 and L4-L5.  IMPRESSION:  Degenerative changes of the lumbar spine. No acute osseous abnormality.   05/09/23 - R shoulder x-ray: IMPRESSION: 1. Deformation of the mid clavicle consistent with remote history of trauma/fracture. 2. Minimal degenerative changes in the glenohumeral joint. 3. Mild AC joint degenerative change.  05/09/23 - R shoulder [place]:  Ultrasound evaluation prior to injection reveals mild hypoechoic fluid tracking within biceps tendon sheath. Subscapularis tendon appears to be intact. Supraspinatus tendon thinned at distal tendon insertion could represent partial tear.  No full-thickness rotator cuff tear is visible. Moderate subacromial bursitis is present. Infraspinatus tendon is intact. AC joint degenerative with moderate effusion. Mid clavicle pseudoarthrosis on ultrasound has what looks to be a joint effusion.  PATIENT SURVEYS:  LEFS 22 / 80 = 27.5 % (06/18/23) and Quick Dash 79.5 / 100 = 79.5 %  COGNITION: Overall cognitive status: History of cognitive impairments - at baseline     SENSATION: WFL Intermittent numbness and tingling in R  UE  POSTURE: rounded shoulders and forward head  UPPER EXTREMITY ROM:   Active ROM Right eval Left eval R 07/06/23  Shoulder flexion 68 ! 166 144 !  Shoulder extension 33 ! 55 39 !  Shoulder abduction 78 ! 180 101 !  Shoulder adduction     Shoulder internal rotation FIR lateral buttock ! FIR WNL FIR mid  buttock !  Shoulder external rotation FER - unable  50 ! FER WNL 81 FER T4 80 !  Elbow flexion     Elbow extension     Wrist flexion     Wrist extension     Wrist ulnar deviation     Wrist radial deviation     Wrist pronation     Wrist supination     (Blank rows = not tested, ! = Pain with motion of R shoulder)  UPPER EXTREMITY MMT:  MMT Right eval Left eval  Shoulder flexion 2+ 5  Shoulder extension 4- 4  Shoulder abduction 2+ 5  Shoulder adduction    Shoulder internal rotation 3+ 5  Shoulder external rotation 3+ 4+  Middle trapezius    Lower trapezius    Elbow flexion    Elbow extension    Wrist flexion    Wrist extension    Wrist ulnar deviation    Wrist radial deviation    Wrist pronation    Wrist supination    Grip strength (lbs)    (Blank rows = not tested)  SHOULDER SPECIAL TESTS: Impingement tests: Neer impingement test: positive   PALPATION:  Increased muscle tension and TTP noted over right clavicle, pec, deltoids, lateral periscapular muscles, and UT/LS. 06/18/23 - increased muscle tension and TTP over B lumbar paraspinals, R>L QL and glutes/piriformis; TTP over B SIJ and R>L greater trochanter   LUMBAR ROM: (06/18/23)  Active  AROM  eval  Flexion Hands to lower thighs ! - Pain in R hip/lumbar spine  Extension 25% limited ! - Pain in thoracolumbar spine  Right lateral flexion Hand to lateral knee  Left lateral flexion Hand to mid thigh ! - Pain in R hip  Right rotation [facility]  Left rotation WFL   (Blank rows = not tested, ! = pain)  MUSCLE LENGTH: (06/18/23) Hamstrings: mild tight B ITB: mod tight R>L Piriformis: mild/mod tight R>L Hip  flexors: mild/mod tight R>L Quads: mild/mod tight R>L Heelcord: NT  LOWER EXTREMITY ROM: (06/18/23) WFL other than as limited by above tightness  LOWER EXTREMITY MMT:    MMT Right 06/18/23 Left 06/18/23  Hip flexion 3+ 4-  Hip extension 4- 4  Hip abduction 4- 4  Hip adduction 3+ 4  Hip internal rotation 4- 4+  Hip external rotation 3 3+  Knee flexion 4- 4  Knee extension 4 4+  Ankle dorsiflexion 4 4  Ankle plantarflexion 4 (9 SLS HR) 4+ (18 SLS HR)  Ankle inversion    Ankle eversion     (Blank rows = not tested)   TODAY'S TREATMENT:  07/09/23 THERAPEUTIC EXERCISE: to improve flexibility, strength and mobility.  Demonstration, verbal and tactile cues throughout for technique.  Pulleys: 3' flexion and 3' scaption - no increased pain in flexion but more uncomfortable in scaption with weakness noted in lower arm Isometrics: 5 sec hold x 5, flexion painful, IR, ER, Ext - mild discomfort with IR  Standing RTB rows 10 x 3", 2 sets - cues for pacing, cues to avoid shoulder shrug and for scapular retraction avoiding excessive shoulder extension or upward rotation of elbows On noodle - scapular retraction x 10, ER x 10 with cues for retraction Seated R QL stretch with R ankle on L knee and left hand sliding on mat to left 2x30 sec Seated figure-4 piriformis stretch 2x 30" B SDLY Clam x 20 B  (L side done with GTB) SDLY hip ABD x 20 B  07/06/23 THERAPEUTIC EXERCISE:  to improve flexibility, strength and mobility.  Demonstration, verbal and tactile cues throughout for technique.  Pulleys: 3' flexion and 1.5' scaption - no increased pain in flexion but more uncomfortable in scaption with weakness noted in lower arm NuStep: L3 x 3 min (UE/LE) Attempted R shoulder isometrics but pt unable to grade effort to level where not causing increased pain  Standing RTB rows 10 x 3", 2 sets - cues for pacing, cues to avoid shoulder shrug and for scapular retraction avoiding excessive shoulder extension or  upward rotation of elbows Seated R QL stretch with side lean x 30" - pt reporting this bothers her shoulder Seated lumbar flexion and L flexion/sidebending stretches with hands on cane for support 2 x 30" each Seated R hip hinge HS stretch 2 x 30" - repeated cues to avoid forcing painful ROM Seated fwd and side-sitting R figure-4 piriformis stretch x 30" - poor tolerance due to increased pain Hooklying R figure-4 piriformis stretch x 30" - better tolerance but repeated cues to avoid forcing painful ROM Hooklying R KTOS piriformis stretch x 30" - better tolerance but repeated cues to avoid forcing painful ROM  THERAPEUTIC ACTIVITIES: R shoulder ROM assessment   06/27/23 THERAPEUTIC EXERCISE: to improve flexibility, strength and mobility.  Demonstration, verbal and tactile cues throughout for technique.  UBE - L1.0 x 5 (4' fwd & 1' back - stopped due to increased pain with backwards) Wall slides R shoulder flexion x 10  Wall slides R shoulder scaption x 10 - pt noting tingling in R arm afterwards Standing YTB rows 10 x 3", 2 sets - cues for pacing, cues to avoid shoulder shrug and for scapular retraction avoiding excessive shoulder extension or upward rotation of elbows Supine AAROM shoulder flexion with cane 2 x 10 - 1 sets with hands parallel on cane, 1st with L hand guiding R from below Supine AAROM R shoulder scaption with cane x 10  Supine AAROM ER at ~45 ABD with cane & towel roll under upper arm x 10   06/21/23 THERAPEUTIC EXERCISE: to improve flexibility, strength and mobility.  Demonstration, verbal and tactile cues throughout for technique.  Pulleys: 3' each flexion and scaption w/o increased pain Seated R shoulder flexion slide with swiss ball 2x10 Seated R shoulder scaption slide with swiss ball 2x10  Supine AAROM shoulder flexion with cane x 10  Supine AAROM R shoulder scaption with cane x 10  Supine AAROM ER with cane x 10  Standing rows YTB 2x10 cues for scapular retraction   Wall slides R shoulder flexion x 10   PATIENT EDUCATION:  Education details: HEP update - lumbar/QL & hip stretches   Person educated: Patient Education method: Explanation, Demonstration, Tactile cues, Verbal cues, and Handouts Education comprehension: verbalized understanding, returned demonstration, verbal cues required, and tactile cues required  HOME EXERCISE PROGRAM: Access Code: [email or web address] URL: [email or web address] Date: 07/06/2023 Prepared by: Glenetta Hew  Exercises - Shoulder Flexion Wall Slide with Towel  - 1 x daily - 7 x weekly - 3 sets - 10 reps - Standing Bilateral Low Shoulder Row with Anchored Resistance  - 1 x daily - 7 x weekly - 3 sets - 10 reps - Supine Shoulder Flexion Overhead with Dowel (Mirrored)  - 1 x daily - 7 x weekly - 2 sets - 10 reps - 3 sec hold - Supine Shoulder Scaption with Dowel  - 1 x daily - 7 x weekly - 3 sets - 10 reps - Supine Shoulder External Rotation in Scaption  AAROM  - 1 x daily - 7 x weekly - 3 sets - 10 reps - Seated Flexion Stretch with Swiss Ball  - 1 x daily - 7 x weekly - 3 reps - 30 sec hold - Seated Thoracic Flexion and Rotation with Swiss Ball  - 1 x daily - 7 x weekly - 3 reps - 30 sec hold - Supine Piriformis Stretch with Foot on Ground  - 1 x daily - 7 x weekly - 3 reps - 30 sec hold - Supine Figure 4 Piriformis Stretch  - 1 x daily - 7 x weekly - 3 reps - 30 sec hold   ASSESSMENT:  CLINICAL IMPRESSION: Sophia Salazar was able to tolerate TE somewhat better today in the shoulder. Able to perform isometrics in all planes except flexion. She did have some discomfort with IR. She still requires cueing with standing rows for speed and form. Good tolerance to strengthening in hips today as well. Sophia Salazar continues to demonstrate potential for improvement and would benefit from continued skilled therapy to address impairments.    OBJECTIVE IMPAIRMENTS: decreased activity tolerance, decreased knowledge of condition, decreased  mobility, decreased ROM, decreased strength, increased fascial restrictions, impaired perceived functional ability, increased muscle spasms, impaired flexibility, improper body mechanics, postural dysfunction, and pain.   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   REHAB POTENTIAL: Good  CLINICAL DECISION MAKING: Evolving/moderate complexity  EVALUATION COMPLEXITY: Moderate   GOALS: Goals reviewed with patient? Yes  SHORT TERM GOALS: Target date: 07/13/2023  Patient will be independent with initial HEP to improve outcomes and carryover.  Baseline:  Goal status: MET  07/06/23  2.  Patient will demonstrate improved R shoulder A/PROM to >/= 90 degrees without increased pain. Baseline: Refer to above UE ROM table Goal status: MET  07/06/23    LONG TERM GOALS: Target date: 08/10/2023  Patient will be independent with ongoing/advanced HEP for self-management at home.  Baseline:  Goal status: IN PROGRESS  2.  Patient will report 50-75% improvement in R shoulder pain to improve QOL.  Baseline: Pain up to 7-8/10 Goal status: IN PROGRESS  3.  Patient to demonstrate improved upright posture with posterior shoulder girdle engaged to promote improved glenohumeral joint mobility. Baseline: Patient demonstrates forward head and rounded shoulders R>L Goal status: IN PROGRESS  4.  Patient to improve R shoulder AROM to [facility] without pain provocation to allow for  increased ease of ADLs.  Baseline: Refer to above UE ROM table Goal status: IN PROGRESS  5.  Patient will demonstrate improved R shoulder strength to >/= 4/5 for functional UE use. Baseline: Refer to above UE MMT table Goal status: IN PROGRESS  6  Patient will report </= 69% on QuickDASH to demonstrate improved functional ability.  Baseline: 79.5 / 100 = 79.5 % Goal status: IN PROGRESS  7.  Patient will report >/=31/80 on LEFS to demonstrate improved functional ability. Baseline: 22 / 80 = 27.5 % Goal status: IN PROGRESS  8.  Patient will report at least 50-75% improvement in R hip pain to improve QOL. Baseline: 3/10 Goal status: IN  PROGRESS  9.  Patient will demonstrate improved B LE strength to >/= 4 to 4+/5 for improved stability and ease of mobility . Baseline: Refer to above LE MMT table Goal status: IN PROGRESS  10.  Patient will be able to sit through church services without need for additional cushioning or limitation due to R hip pain.  Baseline: Patient requires use of additional cushioning to sit through church services. Goal status: IN PROGRESS  11.  Patient will report no limitation with walking and/or climbing stairs due to right hip pain. Baseline: Extreme difficulty reported per LEFS Goal status: IN PROGRESS   PLAN:  PT FREQUENCY: 2x/week  PT DURATION: 6-8 weeks  PLANNED INTERVENTIONS: Therapeutic exercises, Therapeutic activity, Neuromuscular re-education, Patient/Family education, Self Care, Joint mobilization, Aquatic Therapy, Dry Needling, Spinal mobilization, Cryotherapy, Moist heat, Taping, Ultrasound, Manual therapy, and Re-evaluation  PLAN FOR NEXT SESSION: Progress HEP for R hip pain; gentle P/AAROM R shoulder and postural correction/strengthening as well as lumbopelvic flexibility and strengthening   Solon Palm, PT 07/09/2023, 10:17 AM

## 2023-07-12 ENCOUNTER — Ambulatory Visit: Payer: Medicaid Other

## 2023-07-13 ENCOUNTER — Ambulatory Visit: Payer: Medicaid Other | Admitting: Physical Therapy

## 2023-07-13 DIAGNOSIS — R300 Dysuria: Secondary | ICD-10-CM | POA: Diagnosis not present

## 2023-07-13 DIAGNOSIS — R350 Frequency of micturition: Secondary | ICD-10-CM | POA: Diagnosis not present

## 2023-07-13 DIAGNOSIS — N952 Postmenopausal atrophic vaginitis: Secondary | ICD-10-CM | POA: Diagnosis not present

## 2023-07-16 ENCOUNTER — Encounter: Payer: Self-pay | Admitting: Physical Therapy

## 2023-07-16 ENCOUNTER — Ambulatory Visit: Payer: Medicaid Other | Admitting: Physical Therapy

## 2023-07-16 DIAGNOSIS — G8929 Other chronic pain: Secondary | ICD-10-CM

## 2023-07-16 DIAGNOSIS — R262 Difficulty in walking, not elsewhere classified: Secondary | ICD-10-CM | POA: Diagnosis not present

## 2023-07-16 DIAGNOSIS — M25611 Stiffness of right shoulder, not elsewhere classified: Secondary | ICD-10-CM

## 2023-07-16 DIAGNOSIS — M6281 Muscle weakness (generalized): Secondary | ICD-10-CM | POA: Diagnosis not present

## 2023-07-16 DIAGNOSIS — M62838 Other muscle spasm: Secondary | ICD-10-CM | POA: Diagnosis not present

## 2023-07-16 DIAGNOSIS — M25551 Pain in right hip: Secondary | ICD-10-CM | POA: Diagnosis not present

## 2023-07-16 DIAGNOSIS — M25511 Pain in right shoulder: Secondary | ICD-10-CM | POA: Diagnosis not present

## 2023-07-16 DIAGNOSIS — R293 Abnormal posture: Secondary | ICD-10-CM

## 2023-07-16 NOTE — Therapy (Addendum)
OUTPATIENT PHYSICAL THERAPY TREATMENT / DISCHARGE SUMMARY   Patient Name: Sophia Salazar MRN: [id number] DOB:07-04-1961, 62 y.o., female Today's Date: 07/16/2023   END OF SESSION:  PT End of Session - 07/16/23 0931     Visit Number 7    Date for PT Re-Evaluation 08/10/23    Authorization Type [facility] Medicaid    Authorization Time Period 06/18/23 - 08/16/23    Authorization - Visit Number 6    Authorization - Number of Visits 7    PT Start Time 0931    PT Stop Time  1016   PT Time Calculation (min)  45 min   Activity Tolerance Patient tolerated treatment well    Behavior During Therapy [facility] for tasks assessed/performed;Anxious             Past Medical History:  Diagnosis Date   Arthritis    Asthma    Stroke [facility])    1994?   Past Surgical History:  Procedure Laterality Date   COSMETIC SURGERY     on R arm   TUBAL LIGATION     There are no problems to display for this patient.   PCP: [facility]   REFERRING PROVIDER: Rodolph Bong, MD   REFERRING DIAG:  [phone] (ICD-10-CM) - Chronic right shoulder pain  M25.551 (ICD-10-CM) - Right hip pain   THERAPY DIAG:  Chronic right shoulder pain  Stiffness of right shoulder, not elsewhere classified  Abnormal posture  Muscle weakness (generalized)  Other muscle spasm  Pain in right hip  Difficulty in walking, not elsewhere classified  RATIONALE FOR EVALUATION AND TREATMENT: Rehabilitation  ONSET DATE: 1.5 yrs - getting progressively worse  NEXT MD VISIT: TBD   SUBJECTIVE:                                                                                                                                                                                                         SUBJECTIVE STATEMENT:  Pt reports she cleaned her whole house on Thursday and was hurting more for the next few days. She is scheduled for a CT on Thursday to see if she has kidney stones.  PAIN: Are you having  pain? Yes: NPRS scale:  5/10 Pain location: B UE  Pain description: soreness, weakness, pinching  Aggravating factors: vacuuming, lifting laundry basket, washing/brushing hair Relieving factors: ice packs, Voltaren gel, Tylenol, when pain is severe - Tramadol  Are you having pain? 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 Yes. Number of falls 1 - tripped over dog, catching herself with her R arm (but does not think that this aggravated her R UE pain)  LIVING ENVIRONMENT: Lives with: lives with their spouse Lives in: Mobile home Stairs: Yes: External: 2-3 steps; on left going up Has following equipment at home: None  OCCUPATION: unemployed - has hearing pending next Friday for disability   PLOF: Independent with household mobility without device, Independent with community mobility without device, Independent with gait, Needs assistance with ADLs, Needs assistance with homemaking, and Leisure: taking grandchildren to the pool  PATIENT GOALS: "To use my R arm so I could work. Decrease pain."   OBJECTIVE: (objective measures completed at initial evaluation unless otherwise dated)  DIAGNOSTIC FINDINGS:  06/15/23 - R hip x-ray: X-ray images right hip obtained today personally and independently interpreted by Dr. Denyse Amass - SI joint DJD is present.  No significant hip arthritis visible.  No acute fractures are present. FINDINGS: Right hip joint space is maintained. No degenerative changes. On  the left, mild uniform loss of joint space with collar osteophytosis along the left femoral head. Incidentally imaged degenerative changes in the lower lumbar spine. IMPRESSION: 1. Normal right hip. 2. Mild left hip osteoarthritis. 3. Incidentally imaged degenerative changes in the lumbar spine.  08/05/18 - Sacrum & coccyx x-ray: FINDINGS:  Pubic symphysis and rami are intact. No definite acute displaced fracture or malalignment. SI joints are non widened.  IMPRESSION:  No acute osseous abnormality.   08/05/18 - Lumbar spine x-ray: FINDINGS:  Lumbar alignment is within normal limits. The vertebral body heights are maintained. Moderate degenerative changes at L2-L3 and L5-S1 with mild degenerative changes at L3-L4 and L4-L5.  IMPRESSION:  Degenerative changes of the lumbar spine. No acute osseous abnormality.   05/09/23 - R shoulder x-ray: IMPRESSION: 1. Deformation of the mid clavicle consistent with remote history of trauma/fracture. 2. Minimal degenerative changes in the glenohumeral joint. 3. Mild AC joint degenerative change.  05/09/23 - R shoulder [place]:  Ultrasound evaluation prior to injection reveals mild hypoechoic fluid tracking within biceps tendon sheath. Subscapularis tendon appears to be intact. Supraspinatus tendon thinned at distal tendon insertion could represent partial tear.  No full-thickness rotator cuff tear is visible. Moderate subacromial bursitis is present. Infraspinatus tendon is intact. AC joint degenerative with moderate effusion. Mid clavicle pseudoarthrosis on ultrasound has what looks to be a joint effusion.  PATIENT SURVEYS:  LEFS 22 / 80 = 27.5 % (06/18/23) and Quick Dash 79.5 / 100 = 79.5 %  COGNITION: Overall cognitive status: History of cognitive impairments - at baseline     SENSATION: WFL Intermittent numbness and tingling in R UE  POSTURE: rounded shoulders and forward head  UPPER EXTREMITY ROM:   Active ROM Right eval Left eval R  07/06/23  Shoulder flexion 68 ! 166 144 !  Shoulder extension 33 ! 55 39 !  Shoulder abduction 78 ! 180 101 !  Shoulder adduction     Shoulder internal rotation FIR lateral buttock ! FIR WNL FIR mid buttock !  Shoulder external rotation FER - unable  50 ! FER WNL  81 FER T4 80 !  Elbow flexion     Elbow extension     Wrist flexion     Wrist extension     Wrist ulnar deviation     Wrist radial deviation     Wrist pronation     Wrist supination     (Blank rows = not tested, ! = Pain with motion of R shoulder)  UPPER EXTREMITY MMT:  MMT Right eval Left eval  Shoulder flexion 2+ 5  Shoulder extension 4- 4  Shoulder abduction 2+ 5  Shoulder adduction    Shoulder internal rotation 3+ 5  Shoulder external rotation 3+ 4+  Middle trapezius    Lower trapezius    Elbow flexion    Elbow extension    Wrist flexion    Wrist extension    Wrist ulnar deviation    Wrist radial deviation    Wrist pronation    Wrist supination    Grip strength (lbs)    (Blank rows = not tested)  SHOULDER SPECIAL TESTS: Impingement tests: Neer impingement test: positive   PALPATION:  Increased muscle tension and TTP noted over right clavicle, pec, deltoids, lateral periscapular muscles, and UT/LS. 06/18/23 - increased muscle tension and TTP over B lumbar paraspinals, R>L QL and glutes/piriformis; TTP over B SIJ and R>L greater trochanter   LUMBAR ROM: (06/18/23)  Active  AROM  eval  Flexion Hands to lower thighs ! - Pain in R hip/lumbar spine  Extension 25% limited ! - Pain in thoracolumbar spine  Right lateral flexion Hand to lateral knee  Left lateral flexion Hand to mid thigh ! - Pain in R hip  Right rotation [facility]  Left rotation WFL   (Blank rows = not tested, ! = pain)  MUSCLE LENGTH: (06/18/23) Hamstrings: mild tight B ITB: mod tight R>L Piriformis: mild/mod tight R>L Hip flexors: mild/mod tight R>L Quads: mild/mod tight R>L Heelcord: NT  LOWER EXTREMITY ROM: (06/18/23) WFL other than  as limited by above tightness  LOWER EXTREMITY MMT:    MMT Right 06/18/23 Left 06/18/23  Hip flexion 3+ 4-  Hip extension 4- 4  Hip abduction 4- 4  Hip adduction 3+ 4  Hip internal rotation 4- 4+  Hip external rotation 3 3+  Knee flexion 4- 4  Knee extension 4 4+  Ankle dorsiflexion 4 4  Ankle plantarflexion 4 (9 SLS HR) 4+ (18 SLS HR)  Ankle inversion    Ankle eversion     (Blank rows = not tested)   TODAY'S TREATMENT:   07/16/23 THERAPEUTIC EXERCISE: to improve flexibility, strength and mobility.  Demonstration, verbal and tactile cues throughout for technique.  Rec Bike - L2 x 6 min Seated figure-4 piriformis stretch x 30" B - poor tolerance due to increased pain Hooklying figure-4 piriformis stretch x 30" B - better tolerance but repeated cues to avoid forcing painful ROM Hooklying R KTOS piriformis stretch x 30" - better tolerance but repeated cues to avoid forcing painful ROM Hooklying TrA + GTB bent-knee fallout x 10 - cues for slow pace with good eccentric control Hooklying TrA + GTB march x 10 Bridge + GTB hip ABD isometric x 2 - limited by HS cramping Hooklying HS stretch with strap x 30" - limited tolerance for holding strap due to shoulder/UE pain Seated R/L hip hinge HS stretch 2 x 30"  Bridge + GTB hip ABD isometric 10 x 3-5" - cues to press through flat foot rather than heels to reduce risk for HS  cramp   07/09/23 THERAPEUTIC EXERCISE: to improve flexibility, strength and mobility.  Demonstration, verbal and tactile cues throughout for technique.  Pulleys: 3' flexion and 3' scaption - no increased pain in flexion but more uncomfortable in scaption with weakness noted in lower arm Isometrics: 5 sec hold x 5, flexion painful, IR, ER, Ext - mild discomfort with IR  Standing RTB rows 10 x 3", 2 sets - cues for pacing, cues to avoid shoulder shrug and for scapular retraction avoiding excessive shoulder extension or upward rotation of elbows On noodle - scapular  retraction x 10, ER x 10 with cues for retraction Seated R QL stretch with R ankle on L knee and left hand sliding on mat to left 2x30 sec Seated figure-4 piriformis stretch 2 x 30" B SDLY Clam x 20 B  (L side done with GTB) SDLY hip ABD x 20 B   07/06/23 THERAPEUTIC EXERCISE: to improve flexibility, strength and mobility.  Demonstration, verbal and tactile cues throughout for technique.  Pulleys: 3' flexion and 1.5' scaption - no increased pain in flexion but more uncomfortable in scaption with weakness noted in lower arm NuStep: L3 x 3 min (UE/LE) Attempted R shoulder isometrics but pt unable to grade effort to level where not causing increased pain  Standing RTB rows 10 x 3", 2 sets - cues for pacing, cues to avoid shoulder shrug and for scapular retraction avoiding excessive shoulder extension or upward rotation of elbows Seated R QL stretch with side lean x 30" - pt reporting this bothers her shoulder Seated lumbar flexion and L flexion/sidebending stretches with hands on cane for support 2 x 30" each Seated R hip hinge HS stretch 2 x 30" - repeated cues to avoid forcing painful ROM Seated fwd and side-sitting R figure-4 piriformis stretch x 30" - poor tolerance due to increased pain Hooklying R figure-4 piriformis stretch x 30" - better tolerance but repeated cues to avoid forcing painful ROM Hooklying R KTOS piriformis stretch x 30" - better tolerance but repeated cues to avoid forcing painful ROM  THERAPEUTIC ACTIVITIES: R shoulder ROM assessment   PATIENT EDUCATION:  Education details: HEP update - lumbar/QL & hip stretches   Person educated: Patient Education method: Explanation, Demonstration, Tactile cues, Verbal cues, and Handouts Education comprehension: verbalized understanding, returned demonstration, verbal cues required, and tactile cues required  HOME EXERCISE PROGRAM: Access Code: [email or web address] URL: [email or web address] Date: 07/16/2023 Prepared by:  Glenetta Hew  Exercises - Shoulder Flexion Wall Slide with Towel  - 1 x daily - 7 x weekly - 3 sets - 10 reps - Standing Bilateral Low Shoulder Row with Anchored Resistance  - 1 x daily - 7 x weekly - 3 sets - 10 reps - Supine Shoulder Flexion Overhead with Dowel (Mirrored)  - 1 x daily - 7 x weekly - 2 sets - 10 reps - 3 sec hold - Supine Shoulder Scaption with Dowel  - 1 x daily - 7 x weekly - 3 sets - 10 reps - Supine Shoulder External Rotation in Scaption AAROM  - 1 x daily - 7 x weekly - 3 sets - 10 reps - Seated Flexion Stretch with Swiss Ball  - 1 x daily - 7 x weekly - 3 reps - 30 sec hold - Seated Thoracic Flexion and Rotation with Swiss Ball  - 1 x daily - 7 x weekly - 3 reps - 30 sec hold - Supine Piriformis Stretch with Foot on Ground  - 1 x daily - 7  x weekly - 3 reps - 30 sec hold - Supine Figure 4 Piriformis Stretch  - 1 x daily - 7 x weekly - 3 reps - 30 sec hold - Seated Hamstring Stretch  - 3 x daily - 7 x weekly - 3 reps - 30 sec hold - Hooklying Single Leg Bent Knee Fallouts with Resistance  - 1 x daily - 3 x weekly - 2 sets - 10 reps - 3 sec hold - Supine March with Resistance Band  - 1 x daily - 3 x weekly - 2 sets - 10 reps - 2-3 sec hold hold - Bridge with Resistance  - 1 x daily - 3 x weekly - 2 sets - 10 reps - 3-5 sec hold   ASSESSMENT:  CLINICAL IMPRESSION: Sophia Salazar reports she is not sure her R shoulder is getting any better as she continues to have pain and today is also noting L UE pain. She also notes increased B hip soreness after the hip exercises last visit, made worse by cleaning her whole house later in the week. Discussed need for moderation with activity and exercises to avoid increased pain as well as continued to reinforce need to avoid pushing through pain and for feedback during exercises to allow PT to make adjustments to allow for performance w/o increased pain. Today's session focusing on review and progression of hip stretching and strengthening with  update of HEP accordingly - pt to let PT know if issues notes at home. Sophia Salazar will benefit from continued skilled PT to address ongoing hip and shoulder deficits to improve mobility and activity tolerance with decreased pain interference.   OBJECTIVE IMPAIRMENTS: decreased activity tolerance, decreased knowledge of condition, decreased mobility, decreased ROM, decreased strength, increased fascial restrictions, impaired perceived functional ability, increased muscle spasms, impaired flexibility, improper body mechanics, postural dysfunction, and pain.   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   REHAB POTENTIAL: Good  CLINICAL DECISION MAKING: Evolving/moderate complexity  EVALUATION COMPLEXITY: Moderate   GOALS: Goals reviewed with patient? Yes  SHORT TERM GOALS: Target date: 07/13/2023  Patient will be independent with initial HEP to improve outcomes and carryover.  Baseline:  Goal status: MET  07/06/23  2.  Patient will demonstrate improved R shoulder A/PROM to >/= 90 degrees without increased pain. Baseline: Refer to above UE ROM table Goal status: MET  07/06/23   LONG TERM GOALS: Target date: 08/10/2023  Patient will be independent with ongoing/advanced HEP for self-management at home.  Baseline:  Goal status: IN PROGRESS  07/16/23 - HEP updated today  2.  Patient will report 50-75% improvement in R shoulder pain to improve QOL.  Baseline: Pain  up to 7-8/10 Goal status: IN PROGRESS  3.  Patient to demonstrate improved upright posture with posterior shoulder girdle engaged to promote improved glenohumeral joint mobility. Baseline: Patient demonstrates forward head and rounded shoulders R>L Goal status: IN PROGRESS  4.  Patient to improve R shoulder AROM to [facility] without pain provocation to allow for increased ease of ADLs.  Baseline: Refer to above UE ROM table Goal status: IN PROGRESS  5.  Patient will demonstrate improved R shoulder strength to >/= 4/5 for functional UE use. Baseline: Refer to above UE MMT table Goal status: IN PROGRESS  6  Patient will report </= 69% on QuickDASH to demonstrate improved functional ability.  Baseline: 79.5 / 100 = 79.5 % Goal status: IN PROGRESS  7.  Patient will report >/=31/80 on LEFS to demonstrate improved functional ability. Baseline: 22 / 80 = 27.5 % Goal status: IN PROGRESS  8.  Patient will report at least 50-75% improvement in R hip pain to improve QOL. Baseline: 3/10 Goal status: IN PROGRESS  9.  Patient will demonstrate improved B LE strength to >/= 4 to 4+/5 for improved stability and ease of mobility . Baseline: Refer to above LE MMT table Goal status: IN PROGRESS  10.  Patient will be able to sit through church services without need for additional cushioning or limitation due to R hip pain.  Baseline: Patient requires use of additional cushioning to sit through church services. Goal status: IN PROGRESS  11.  Patient will report no limitation with walking and/or climbing stairs due to right hip pain. Baseline: Extreme difficulty reported per LEFS Goal status: IN PROGRESS   PLAN:  PT FREQUENCY: 2x/week  PT DURATION: 6-8 weeks  PLANNED INTERVENTIONS: Therapeutic exercises, Therapeutic activity, Neuromuscular re-education, Patient/Family education, Self Care, Joint mobilization, Aquatic Therapy, Dry Needling, Spinal mobilization, Cryotherapy, Moist heat, Taping,  Ultrasound, Manual therapy, and Re-evaluation  PLAN FOR NEXT SESSION: gentle P/AA/AROM R shoulder and postural correction/strengthening; lumbopelvic/proximal LE flexibility and strengthening; review/update HEP PRN    Marry Guan, PT  07/16/2023, 10:24 AM  PHYSICAL THERAPY DISCHARGE SUMMARY  Visits from Start of Care: 7  Current functional level related to goals / functional outcomes: Refer to above clinical impression and goal assessment for status as of last visit on 07/16/2023. Patient no showed for remaining scheduled visits and has not returned to PT in >30 days, therefore will proceed with discharge from PT for this episode.     Remaining deficits: As above. Unable to formally assess status at discharge due to failure to return to PT.   Education / Equipment: HEP   Patient agrees to discharge. Patient goals were partially met. Patient is being discharged due to not returning since the last visit.  Marry Guan, PT 10/25/23, 10:19 AM  [facility] [address]  [address] [place], [place], [address] Phone: [phone]   Fax:  [phone]

## 2023-07-19 DIAGNOSIS — N200 Calculus of kidney: Secondary | ICD-10-CM | POA: Diagnosis not present

## 2023-07-19 DIAGNOSIS — K7689 Other specified diseases of liver: Secondary | ICD-10-CM | POA: Diagnosis not present

## 2023-07-19 DIAGNOSIS — R102 Pelvic and perineal pain: Secondary | ICD-10-CM | POA: Diagnosis not present

## 2023-07-20 ENCOUNTER — Ambulatory Visit: Payer: Medicaid Other

## 2023-07-24 ENCOUNTER — Other Ambulatory Visit: Payer: Self-pay

## 2023-07-24 MED ORDER — TRAMADOL HCL 50 MG PO TABS: 50.0000 mg | ORAL_TABLET | Freq: Two times a day (BID) | ORAL | 0 refills | Status: DC | PRN

## 2023-07-24 NOTE — Telephone Encounter (Signed)
Last OV 06/15/23  Last refill 05/09/23  Qty #15/0  Forwarding to Dr. Denyse Amass

## 2023-07-27 ENCOUNTER — Ambulatory Visit: Payer: Medicaid Other | Attending: Family Medicine

## 2023-07-29 DIAGNOSIS — M19011 Primary osteoarthritis, right shoulder: Secondary | ICD-10-CM | POA: Diagnosis not present

## 2023-07-29 DIAGNOSIS — X500XXA Overexertion from strenuous movement or load, initial encounter: Secondary | ICD-10-CM | POA: Diagnosis not present

## 2023-07-29 DIAGNOSIS — S42021K Displaced fracture of shaft of right clavicle, subsequent encounter for fracture with nonunion: Secondary | ICD-10-CM | POA: Diagnosis not present

## 2023-07-29 DIAGNOSIS — S42031G Displaced fracture of lateral end of right clavicle, subsequent encounter for fracture with delayed healing: Secondary | ICD-10-CM | POA: Diagnosis not present

## 2023-07-30 ENCOUNTER — Ambulatory Visit: Payer: Medicaid Other | Admitting: Family Medicine

## 2023-07-30 ENCOUNTER — Encounter: Payer: Self-pay | Admitting: Family Medicine

## 2023-07-30 VITALS — BP 124/82 | HR 94 | Ht 66.0 in | Wt 164.0 lb

## 2023-07-30 DIAGNOSIS — M25511 Pain in right shoulder: Secondary | ICD-10-CM

## 2023-07-30 DIAGNOSIS — G8929 Other chronic pain: Secondary | ICD-10-CM | POA: Diagnosis not present

## 2023-07-30 MED ORDER — LORAZEPAM 0.5 MG PO TABS: ORAL_TABLET | ORAL | 0 refills | Status: DC

## 2023-07-30 NOTE — Patient Instructions (Addendum)
Thank you for coming in today.   You should hear from MRI scheduling within 1 week. If you do not hear please let me know.    Take the anxiety medicine before the MRI and get someone to drive you.   Once I get the results back we likely will have you see a surgeon who is good and nice.

## 2023-07-30 NOTE — Progress Notes (Signed)
   I, Stevenson Clinch, CMA acting as a scribe for [name], MD.  Sophia Salazar is a 62 y.o. female who presents to [facility] today for exacerbation of her R shoulder pain. Pt was last seen by Dr. Denyse Amass on 06/15/23 and was advised to cont PT, completing 7 visits. Last R subacromial steroid injection was on 05/09/23.  Today, pt reports painful snap in the shoulder after reaching back for toilet tissue. Pain throughout the entire joint radiating into the arm. Used ice at the time of injury. Had eval at the ED. Sx causing weakness and difficulty with ADL's. Would like to discuss surgery.   Dx imaging: 06/16/22 R shoulder XR   Pertinent review of systems: No fevers or chills  Relevant historical information: Nonunion chronic fracture right clavicle.   Exam:  BP 124/82   Pulse 94   Ht 5\' 6"  (1.676 m)   Wt 164 lb (74.4 kg)   SpO2 95%   BMI 26.47 kg/m  General: Well Developed, well nourished, and in no acute distress.   MSK: Right shoulder very limited range of motion.  Tender palpation superior shoulder near the [facility] joint.  Nontender at the chronic fracture clavicle.    Lab and Radiology Results  X-ray images clavicle obtained at [facility] August 4 personally independently interpreted. Chronic fracture mid clavicle.  Significant AC DJD.  No acute fractures are visible.     Assessment and Plan: 62 y.o. female with chronic right shoulder pain.  This occurs in the setting of a chronic clavicle fracture with nonunion.  I do not think the chronic clavicle fracture is directly responsible for her pain.  I think is most likely that she has rotator cuff dysfunction or tendinitis and AC DJD.  Plan for MRI of the shoulder including clavicle images.  Anticipate referral to orthopedic surgery after the MRI results are back.  We talked about MRI claustrophobia.  Lorazepam prescribed.  She will need a driver.   PDMP not reviewed this encounter. Orders Placed  This Encounter  Procedures   MR SHOULDER RIGHT WO CONTRAST    Include clavicle where chronic fracture is.    Standing Status:   Future    Standing Expiration Date:   07/29/2024    Order Specific Question:   What is the patient's sedation requirement?    Answer:   No Sedation    Order Specific Question:   Does the patient have a pacemaker or implanted devices?    Answer:   No    Order Specific Question:   Preferred imaging location?    Answer:   [facility] (table limit-550lbs)   Meds ordered this encounter  Medications   LORazepam (ATIVAN) 0.5 MG tablet    Sig: 1-2 tabs 30 - 60 min prior to MRI. Do not drive with this medicine.    Dispense:  4 tablet    Refill:  0     Discussed warning signs or symptoms. Please see discharge instructions. Patient expresses understanding.   The above documentation has been reviewed and is accurate and complete [name], M.D.

## 2023-08-03 ENCOUNTER — Ambulatory Visit: Payer: Medicaid Other | Admitting: Physical Therapy

## 2023-08-10 ENCOUNTER — Ambulatory Visit: Payer: Medicaid Other | Admitting: Physical Therapy

## 2023-08-14 DIAGNOSIS — M25511 Pain in right shoulder: Secondary | ICD-10-CM | POA: Diagnosis not present

## 2023-08-14 DIAGNOSIS — S42031A Displaced fracture of lateral end of right clavicle, initial encounter for closed fracture: Secondary | ICD-10-CM | POA: Diagnosis not present

## 2023-08-16 DIAGNOSIS — N3091 Cystitis, unspecified with hematuria: Secondary | ICD-10-CM | POA: Diagnosis not present

## 2023-08-17 ENCOUNTER — Ambulatory Visit: Payer: Medicaid Other

## 2023-08-20 ENCOUNTER — Ambulatory Visit
Admission: RE | Admit: 2023-08-20 | Discharge: 2023-08-20 | Disposition: A | Discharge status: Home / Self Care | Payer: Medicaid Other | Source: Ambulatory Visit | Attending: Family Medicine | Admitting: Family Medicine

## 2023-08-20 DIAGNOSIS — G8929 Other chronic pain: Secondary | ICD-10-CM

## 2023-08-23 DIAGNOSIS — S42031A Displaced fracture of lateral end of right clavicle, initial encounter for closed fracture: Secondary | ICD-10-CM | POA: Diagnosis not present

## 2023-08-23 DIAGNOSIS — M25511 Pain in right shoulder: Secondary | ICD-10-CM | POA: Diagnosis not present

## 2023-08-28 NOTE — Progress Notes (Signed)
MRI shows complete rotator cuff tear and tendinitis of other rotator cuff tendons.  I think you should see a Careers adviser.  Would you do like me to refer you?

## 2023-08-29 ENCOUNTER — Other Ambulatory Visit: Payer: Self-pay

## 2023-08-29 DIAGNOSIS — G8929 Other chronic pain: Secondary | ICD-10-CM

## 2023-09-06 DIAGNOSIS — M25511 Pain in right shoulder: Secondary | ICD-10-CM | POA: Diagnosis not present

## 2023-09-06 DIAGNOSIS — S42031A Displaced fracture of lateral end of right clavicle, initial encounter for closed fracture: Secondary | ICD-10-CM | POA: Diagnosis not present

## 2023-09-07 ENCOUNTER — Encounter: Payer: Medicaid Other | Admitting: Physical Therapy

## 2023-09-10 ENCOUNTER — Ambulatory Visit (INDEPENDENT_AMBULATORY_CARE_PROVIDER_SITE_OTHER): Payer: Medicaid Other | Admitting: Orthopedic Surgery

## 2023-09-10 DIAGNOSIS — S42021K Displaced fracture of shaft of right clavicle, subsequent encounter for fracture with nonunion: Secondary | ICD-10-CM

## 2023-09-10 DIAGNOSIS — M5412 Radiculopathy, cervical region: Secondary | ICD-10-CM | POA: Diagnosis not present

## 2023-09-10 DIAGNOSIS — M25511 Pain in right shoulder: Secondary | ICD-10-CM | POA: Diagnosis not present

## 2023-09-10 DIAGNOSIS — M75121 Complete rotator cuff tear or rupture of right shoulder, not specified as traumatic: Secondary | ICD-10-CM | POA: Diagnosis not present

## 2023-09-10 DIAGNOSIS — S42031A Displaced fracture of lateral end of right clavicle, initial encounter for closed fracture: Secondary | ICD-10-CM | POA: Diagnosis not present

## 2023-09-12 ENCOUNTER — Encounter: Payer: Self-pay | Admitting: Orthopedic Surgery

## 2023-09-12 NOTE — Progress Notes (Signed)
Office Visit Note   Patient: Sophia Salazar           Date of Birth: 27-Apr-1961           MRN: [id number] Visit Date: 09/10/2023 Requested by: Rodolph Bong, MD [address] [place],  [place] [address] PCP: [facility]  Subjective: Chief Complaint  Patient presents with   Right Shoulder - Pain    HPI: Sophia Salazar is a 62 y.o. female who presents to the office reporting right shoulder pain.  Patient reports chronic pain for the past 2 to 3 years.  Has become worse during that time.  She is right-hand dominant.  Pain wakes her from sleep at night.  The pain does radiate to the forearm.  Describes episodic numbness and tingling as well as some neck pain.  Denies any scapular pain.  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  Currently unemployed.  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  Has been relatively asymptomatic for her since that nonunion.  She does describe popping and grinding in the shoulder region.  She also describes a snapping sensation which resulted in decreased function for several days.  Clearly localizes this more to the deltoid region and not really the clavicular region.  Patient also describes a history of neck pain and radiculopathy.  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  She went to a chiropractor at that time with traction therapy employed and significant relief of symptoms.  Never did have cervical spine ESI or surgery which was recommended at the time.  Patient does have an MRI scan of the shoulder which is reviewed with her.  Shows complete tear of the supraspinatus tendon with 3.1 cm of retraction.  Atrophy is present in the supraspinatus muscle belly.  There is also a ganglion cyst within the infraspinatus muscle.  There is also moderate tendinosis of  the biceps tendon..                ROS: All systems reviewed are negative as they relate to the chief complaint within the history of present illness.  Patient denies fevers or chills.  Assessment & Plan: Visit Diagnoses:  1. Complete tear of right rotator cuff, unspecified whether traumatic   2. 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 Radiculopathy, cervical region     Plan: Impression is right shoulder pain with complex problems going on in that right upper extremity.  In general she does have some symptoms which could be radicular in nature.  Surprisingly no left-sided symptoms which was definitely her worst side on MRI scanning 2 years ago.  Not really reporting much in the way of dorsal hand numbness.  I think the neck is a partial pain generator contributor but not the major 1.  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  Not particular tender to palpation and she does not really localize much of her symptoms to that clavicular region.  This could be addressed surgically but she has been living with it for about 10 years and I think it is more likely that more of her symptoms are coming from the shoulder.  Regarding the shoulder she does have a retracted rotator cuff tear.  Biceps tendon is  likely involved as well.  Not too keen on injections.  I think surgery could be considered with biceps tenodesis and an attempt at rotator cuff repair.  In her case we would almost certainly have to augment with the patch.  That may relieve some of her symptoms.  Based on how much pain she is having today in general with passive range of motion I think the shoulder is the likely culprit.  Does not appear to be frozen based on maintenance of passive range of motion.  The risk and benefits of surgical intervention are discussed with the patient including but not limited to infection nerve and vessel damage incomplete resolution of pain as well as  incomplete functional return.  I think is possible that she may require cervical spine diagnostic injections in the future.  All questions answered.  Follow-Up Instructions: No follow-ups on file.   Orders:  No orders of the defined types were placed in this encounter.  No orders of the defined types were placed in this encounter.     Procedures: No procedures performed   Clinical Data: No additional findings.  Objective: Vital Signs: There were no vitals taken for this visit.  Physical Exam:  Constitutional: Patient appears well-developed HEENT:  Head: Normocephalic Eyes:EOM are normal Neck: Normal range of motion Cardiovascular: Normal rate Pulmonary/chest: Effort normal Neurologic: Patient is alert Skin: Skin is warm Psychiatric: Patient has normal mood and affect  Ortho Exam: Ortho exam demonstrates active forward flexion and abduction on the right greater than 90 degrees.  Passive range of motion bilaterally is 60/105/160.  Definitely more painful on the right than the left.  She has 5+ out of 5 external rotation strength on the left compared to 5 out of 5 on the right.  Subscap strength 5+ out of 5 bilaterally.  Bicipital groove tenderness is present.  No discrete AC joint tenderness is present and she is not particularly tender over her midshaft clavicular nonunion.  With passive arm range of motion I do not feel a lot of crepitus or grinding around that midshaft of the clavicle.  She does have some popping in the shoulder region.  No masses lymphadenopathy or skin changes noted in that shoulder girdle region.  Specialty Comments:  No specialty comments available.  Imaging: No results found.   PMFS History: There are no problems to display for this patient.  Past Medical History:  Diagnosis Date   Arthritis    Asthma    Stroke [facility])    1994?    No family history on file.  Past Surgical History:  Procedure Laterality Date   COSMETIC SURGERY     on R arm    TUBAL LIGATION     Social History   Occupational History   Not on file  Tobacco Use   Smoking status: Never   Smokeless tobacco: Never  Vaping Use   Vaping status: Never Used  Substance and Sexual Activity   Alcohol use: Yes    Alcohol/week: 1.0 standard drink of alcohol    Types: 1 Glasses of wine per week   Drug use: No   Sexual activity: Not on file

## 2023-09-21 ENCOUNTER — Other Ambulatory Visit: Payer: Self-pay | Admitting: Family Medicine

## 2023-10-02 DIAGNOSIS — S0501XA Injury of conjunctiva and corneal abrasion without foreign body, right eye, initial encounter: Secondary | ICD-10-CM | POA: Diagnosis not present

## 2023-10-18 ENCOUNTER — Ambulatory Visit: Payer: 59 | Admitting: Family Medicine

## 2023-10-22 ENCOUNTER — Ambulatory Visit: Payer: 59 | Admitting: Family Medicine

## 2023-11-14 DIAGNOSIS — R070 Pain in throat: Secondary | ICD-10-CM | POA: Diagnosis not present

## 2023-11-14 DIAGNOSIS — J324 Chronic pansinusitis: Secondary | ICD-10-CM | POA: Diagnosis not present

## 2023-12-20 DIAGNOSIS — M79601 Pain in right arm: Secondary | ICD-10-CM | POA: Diagnosis not present

## 2023-12-21 ENCOUNTER — Ambulatory Visit (INDEPENDENT_AMBULATORY_CARE_PROVIDER_SITE_OTHER): Payer: 59 | Admitting: Orthopedic Surgery

## 2023-12-21 ENCOUNTER — Encounter: Payer: Self-pay | Admitting: Orthopedic Surgery

## 2023-12-21 DIAGNOSIS — M75121 Complete rotator cuff tear or rupture of right shoulder, not specified as traumatic: Secondary | ICD-10-CM | POA: Diagnosis not present

## 2023-12-21 NOTE — Progress Notes (Signed)
Office Visit Note   Patient: Sophia Salazar           Date of Birth: 04/03/1961           MRN: [id number] Visit Date: 12/21/2023 Requested by: [facility] [address] [place],  [place] [address] PCP: [facility]  Subjective: Chief Complaint  Patient presents with   Other     Right arm pain     HPI: Sophia Salazar is a 62 y.o. female who presents to the office reporting right arm pain.  She is scheduled for surgery on 01/08/2024.  On 1224 she was lifting a mop and bucket and felt a pop in her right arm.  Reporting severe arm pain since that time with bruising but that has slowly started to improve..                ROS: All systems reviewed are negative as they relate to the chief complaint within the history of present illness.  Patient denies fevers or chills.  Assessment & Plan: Visit Diagnoses:  1. Complete tear of right rotator cuff, unspecified whether traumatic     Plan: Impression is biceps tendon rupture of the right arm.  She still does have retracted rotator cuff tear with tendinosis.  Rena give her a note for no housecleaning for 3 and half months which will cover her acute injury with the biceps tendon as well as her postop course after rotator cuff repair.  Discussed the risk and benefits of the procedure again today.  In general I do not think that the supraspinatus tear will be repairable in a watertight fashion.  I think she will require patch augmentation and we will try to do this to delay her need for shoulder replacement.  This biceps rupture may give her some pain relief once the dust settles.  It has retracted too distal to the mid humerus at this time so retraction of that tendon unlikely at the time of surgery.  Follow-Up Instructions: No follow-ups on file.   Orders:  No orders of the defined types were placed in this encounter.  No orders of the defined types were placed in this encounter.     Procedures: No procedures  performed   Clinical Data: No additional findings.  Objective: Vital Signs: There were no vitals taken for this visit.  Physical Exam:  Constitutional: Patient appears well-developed HEENT:  Head: Normocephalic Eyes:EOM are normal Neck: Normal range of motion Cardiovascular: Normal rate Pulmonary/chest: Effort normal Neurologic: Patient is alert Skin: Skin is warm Psychiatric: Patient has normal mood and affect  Ortho Exam: Ortho exam demonstrates some bruising and ecchymosis with Popeye deformity in that right arm.  Still has the same external rotation strength on the right that she had about a month ago.  Subscap strength also intact at 5+ out of 5.  Distal biceps tendon palpable with no decrease in supination strength versus the left arm.  Specialty Comments:  No specialty comments available.  Imaging: No results found.   PMFS History: There are no active problems to display for this patient.  Past Medical History:  Diagnosis Date   Arthritis    Asthma    Stroke [facility])    1994?    No family history on file.  Past Surgical History:  Procedure Laterality Date   COSMETIC SURGERY     on R arm   TUBAL LIGATION     Social History   Occupational History  Not on file  Tobacco Use   Smoking status: Never   Smokeless tobacco: Never  Vaping Use   Vaping status: Never Used  Substance and Sexual Activity   Alcohol use: Yes    Alcohol/week: 1.0 standard drink of alcohol    Types: 1 Glasses of wine per week   Drug use: No   Sexual activity: Not on file

## 2023-12-31 NOTE — Pre-Procedure Instructions (Signed)
 Surgical Instructions   Your procedure is scheduled on January 08, 2024. Report to [facility] Main Entrance A at 9:20 A.M., then check in with the Admitting office. Any questions or running late day of surgery: call [phone]  Questions prior to your surgery date: call [phone], Monday-Friday, 8am-4pm. If you experience any cold or flu symptoms such as cough, fever, chills, shortness of breath, etc. between now and your scheduled surgery, please notify us  at the above number.     Remember:  Do not eat after midnight the night before your surgery   You may drink clear liquids until 8:20 AM the morning of your surgery.   Clear liquids allowed are: Water, Non-Citrus Juices (without pulp), Carbonated Beverages, Clear Tea (no milk, honey, etc.), Black Coffee Only (NO MILK, CREAM OR POWDERED CREAMER of any kind), and Gatorade.  Patient Instructions  The night before surgery:  No food after midnight. ONLY clear liquids after midnight  The day of surgery (if you do NOT have diabetes):  Drink ONE (1) Pre-Surgery Clear Ensure by 8:20 AM the morning of surgery. Drink in one sitting. Do not sip.  This drink was given to you during your hospital  pre-op appointment visit.  Nothing else to drink after completing the  Pre-Surgery Clear Ensure.         If you have questions, please contact your surgeon's office.     Take these medicines the morning of surgery with A SIP OF WATER: acetaminophen  (TYLENOL )  budesonide -formoterol (SYMBICORT) inhaler  ketotifen (ZADITOR) ophthalmic solution  loratadine  (CLARITIN )  Propylene Glycol (SYSTANE COMPLETE) eye drops   May take these medicines IF NEEDED: albuterol  (PROVENTIL  HFA;VENTOLIN  HFA) inhaler - please bring inhaler with you morning of surgery hydrOXYzine (ATARAX)    One week prior to surgery, STOP taking any Aspirin (unless otherwise instructed by your surgeon) Aleve, Naproxen, Ibuprofen , Motrin , Advil , Goody's, BC's, all herbal  medications, fish oil, and non-prescription vitamins. This includes your medication: diclofenac Sodium (VOLTAREN) GEL and meloxicam (MOBIC)                       Do NOT Smoke (Tobacco/Vaping) for 24 hours prior to your procedure.  If you use a CPAP at night, you may bring your mask/headgear for your overnight stay.   You will be asked to remove any contacts, glasses, piercing's, hearing aid's, dentures/partials prior to surgery. Please bring cases for these items if needed.    Patients discharged the day of surgery will not be allowed to drive home, and someone needs to stay with them for 24 hours.  SURGICAL WAITING ROOM VISITATION Patients may have no more than 2 support people in the waiting area - these visitors may rotate.   Pre-op nurse will coordinate an appropriate time for 1 ADULT support person, who may not rotate, to accompany patient in pre-op.  Children under the age of 49 must have an adult with them who is not the patient and must remain in the main waiting area with an adult.  If the patient needs to stay at the hospital during part of their recovery, the visitor guidelines for inpatient rooms apply.  Please refer to the [facility] website for the visitor guidelines for any additional information.   If you received a COVID test during your pre-op visit  it is requested that you wear a mask when out in public, stay away from anyone that may not be feeling well and notify your surgeon if you develop  symptoms. If you have been in contact with anyone that has tested positive in the last 10 days please notify you surgeon.      Pre-operative CHG Bathing Instructions   You can play a key role in reducing the risk of infection after surgery. Your skin needs to be as free of germs as possible. You can reduce the number of germs on your skin by washing with CHG (chlorhexidine  gluconate) soap before surgery. CHG is an antiseptic soap that kills germs and continues to kill germs even  after washing.   DO NOT use if you have an allergy to chlorhexidine /CHG or antibacterial soaps. If your skin becomes reddened or irritated, stop using the CHG and notify one of our RNs at [phone].              TAKE A SHOWER THE NIGHT BEFORE SURGERY AND THE DAY OF SURGERY    Please keep in mind the following:  DO NOT shave, including legs and underarms, 48 hours prior to surgery.   You may shave your face before/day of surgery.  Place clean sheets on your bed the night before surgery Use a clean washcloth (not used since being washed) for each shower. DO NOT sleep with pet's night before surgery.  CHG Shower Instructions:  Wash your face and private area with normal soap. If you choose to wash your hair, wash first with your normal shampoo.  After you use shampoo/soap, rinse your hair and body thoroughly to remove shampoo/soap residue.  Turn the water OFF and apply half the bottle of CHG soap to a CLEAN washcloth.  Apply CHG soap ONLY FROM YOUR NECK DOWN TO YOUR TOES (washing for 3-5 minutes)  DO NOT use CHG soap on face, private areas, open wounds, or sores.  Pay special attention to the area where your surgery is being performed.  If you are having back surgery, having someone wash your back for you may be helpful. Wait 2 minutes after CHG soap is applied, then you may rinse off the CHG soap.  Pat dry with a clean towel  Put on clean pajamas    Additional instructions for the day of surgery: DO NOT APPLY any lotions, deodorants, cologne, or perfumes.   Do not wear jewelry or makeup Do not wear nail polish, gel polish, artificial nails, or any other type of covering on natural nails (fingers and toes) Do not bring valuables to the hospital. [facility] is not responsible for valuables/personal belongings. Put on clean/comfortable clothes.  Please brush your teeth.  Ask your nurse before applying any prescription medications to the skin.

## 2024-01-01 ENCOUNTER — Encounter (HOSPITAL_COMMUNITY): Payer: Self-pay

## 2024-01-01 ENCOUNTER — Other Ambulatory Visit: Payer: Self-pay

## 2024-01-01 ENCOUNTER — Encounter (HOSPITAL_COMMUNITY)
Admission: RE | Admit: 2024-01-01 | Discharge: 2024-01-01 | Disposition: A | Discharge status: Home / Self Care | Payer: Medicaid Other | Source: Ambulatory Visit | Attending: Orthopedic Surgery | Admitting: Orthopedic Surgery

## 2024-01-01 VITALS — BP 132/94 | HR 115 | Temp 98.0°F | Resp 18 | Ht 66.0 in | Wt 168.3 lb

## 2024-01-01 DIAGNOSIS — Z01818 Encounter for other preprocedural examination: Secondary | ICD-10-CM | POA: Insufficient documentation

## 2024-01-01 DIAGNOSIS — I10 Essential (primary) hypertension: Secondary | ICD-10-CM | POA: Insufficient documentation

## 2024-01-01 HISTORY — DX: Essential (primary) hypertension: I10

## 2024-01-01 HISTORY — DX: Anxiety disorder, unspecified: F41.9

## 2024-01-01 NOTE — Progress Notes (Addendum)
 PCP - Silvano Corrigan, FNP ([facility]) Cardiologist - denies  PPM/ICD -denies  Device Orders - n/a Rep Notified - n/a  Chest x-ray - denies EKG - 01/01/2024 Stress Test - denies ECHO - denies Cardiac Cath - denies  Sleep Study - denies CPAP - n/a  Fasting Blood Sugar - no DM Checks Blood Sugar _____ times a day  Last dose of GLP1 agonist-  n/a GLP1 instructions: n/a  Blood Thinner Instructions: n/a Aspirin Instructions: n/a  ERAS Protcol - yes; till 0820 AM PRE-SURGERY Ensure or G2- Ensure  COVID TEST- n/a   Anesthesia review: no  Patient denies shortness of breath, fever, cough and chest pain at PAT appointment   All instructions explained to the patient, with a verbal understanding of the material. Patient agrees to go over the instructions while at home for a better understanding. Patient also instructed to self quarantine after being tested for COVID-19. The opportunity to ask questions was provided.

## 2024-01-07 ENCOUNTER — Telehealth: Payer: Self-pay | Admitting: Orthopedic Surgery

## 2024-01-07 NOTE — Telephone Encounter (Signed)
 Called patient to let her know her surgery case for right shoulder at [facility] Main with Dr. Addie 01-08-24  has been moved to 7:30am with an arrival time of 5:30am.  Unable to speak with patient. Left message on voice mail and called spouse's number to leave the same message.

## 2024-01-08 ENCOUNTER — Ambulatory Visit (HOSPITAL_COMMUNITY): Payer: Medicaid Other

## 2024-01-08 ENCOUNTER — Encounter (HOSPITAL_COMMUNITY)
Admission: RE | Disposition: A | Discharge status: Home / Self Care | Payer: Self-pay | Source: Home / Self Care | Attending: Orthopedic Surgery

## 2024-01-08 ENCOUNTER — Other Ambulatory Visit: Payer: Self-pay

## 2024-01-08 ENCOUNTER — Ambulatory Visit (HOSPITAL_COMMUNITY)
Admission: RE | Admit: 2024-01-08 | Discharge: 2024-01-08 | Disposition: A | Discharge status: Home / Self Care | Payer: Medicaid Other | Attending: Orthopedic Surgery | Admitting: Orthopedic Surgery

## 2024-01-08 DIAGNOSIS — F419 Anxiety disorder, unspecified: Secondary | ICD-10-CM

## 2024-01-08 DIAGNOSIS — S43431A Superior glenoid labrum lesion of right shoulder, initial encounter: Secondary | ICD-10-CM

## 2024-01-08 DIAGNOSIS — Z8673 Personal history of transient ischemic attack (TIA), and cerebral infarction without residual deficits: Secondary | ICD-10-CM | POA: Diagnosis not present

## 2024-01-08 DIAGNOSIS — G8929 Other chronic pain: Secondary | ICD-10-CM | POA: Diagnosis not present

## 2024-01-08 DIAGNOSIS — M65911 Unspecified synovitis and tenosynovitis, right shoulder: Secondary | ICD-10-CM | POA: Diagnosis not present

## 2024-01-08 DIAGNOSIS — Z79899 Other long term (current) drug therapy: Secondary | ICD-10-CM | POA: Diagnosis not present

## 2024-01-08 DIAGNOSIS — M75101 Unspecified rotator cuff tear or rupture of right shoulder, not specified as traumatic: Secondary | ICD-10-CM | POA: Insufficient documentation

## 2024-01-08 DIAGNOSIS — Z01818 Encounter for other preprocedural examination: Secondary | ICD-10-CM

## 2024-01-08 DIAGNOSIS — S46211S Strain of muscle, fascia and tendon of other parts of biceps, right arm, sequela: Secondary | ICD-10-CM

## 2024-01-08 DIAGNOSIS — I10 Essential (primary) hypertension: Secondary | ICD-10-CM

## 2024-01-08 DIAGNOSIS — M669 Spontaneous rupture of unspecified tendon: Secondary | ICD-10-CM | POA: Diagnosis not present

## 2024-01-08 DIAGNOSIS — J45909 Unspecified asthma, uncomplicated: Secondary | ICD-10-CM

## 2024-01-08 DIAGNOSIS — S46211A Strain of muscle, fascia and tendon of other parts of biceps, right arm, initial encounter: Secondary | ICD-10-CM | POA: Diagnosis not present

## 2024-01-08 DIAGNOSIS — M75121 Complete rotator cuff tear or rupture of right shoulder, not specified as traumatic: Secondary | ICD-10-CM | POA: Diagnosis not present

## 2024-01-08 DIAGNOSIS — M19011 Primary osteoarthritis, right shoulder: Secondary | ICD-10-CM | POA: Insufficient documentation

## 2024-01-08 HISTORY — PX: SHOULDER ARTHROSCOPY WITH SUBACROMIAL DECOMPRESSION, ROTATOR CUFF REPAIR AND BICEP TENDON REPAIR: SHX5687

## 2024-01-08 SURGERY — SHOULDER ARTHROSCOPY WITH SUBACROMIAL DECOMPRESSION, ROTATOR CUFF REPAIR AND BICEP TENDON REPAIR
Anesthesia: General | Laterality: Right

## 2024-01-08 MED ORDER — EPHEDRINE 5 MG/ML INJ
INTRAVENOUS | Status: AC
  Filled 2024-01-08: qty 5

## 2024-01-08 MED ORDER — PROPOFOL 10 MG/ML IV BOLUS
INTRAVENOUS | Status: DC | PRN
  Administered 2024-01-08: 200 mg via INTRAVENOUS

## 2024-01-08 MED ORDER — DEXAMETHASONE SODIUM PHOSPHATE 10 MG/ML IJ SOLN
INTRAMUSCULAR | Status: AC
  Filled 2024-01-08: qty 1

## 2024-01-08 MED ORDER — ACETAMINOPHEN 500 MG PO TABS: 1000.0000 mg | ORAL_TABLET | Freq: Once | ORAL | Status: DC | PRN

## 2024-01-08 MED ORDER — BUPIVACAINE-EPINEPHRINE (PF) 0.5% -1:200000 IJ SOLN
INTRAMUSCULAR | Status: DC | PRN
  Administered 2024-01-08: 15 mL via PERINEURAL

## 2024-01-08 MED ORDER — EPINEPHRINE PF 1 MG/ML IJ SOLN
INTRAMUSCULAR | Status: AC
  Filled 2024-01-08: qty 1

## 2024-01-08 MED ORDER — EPINEPHRINE PF 1 MG/ML IJ SOLN
INTRAMUSCULAR | Status: DC | PRN
  Administered 2024-01-08: 2 mg

## 2024-01-08 MED ORDER — FENTANYL CITRATE (PF) 250 MCG/5ML IJ SOLN
INTRAMUSCULAR | Status: AC
  Filled 2024-01-08: qty 5

## 2024-01-08 MED ORDER — DEXMEDETOMIDINE HCL IN NACL 80 MCG/20ML IV SOLN
INTRAVENOUS | Status: DC | PRN
  Administered 2024-01-08 (×2): 8 ug via INTRAVENOUS

## 2024-01-08 MED ORDER — 0.9 % SODIUM CHLORIDE (POUR BTL) OPTIME
TOPICAL | Status: DC | PRN
  Administered 2024-01-08: 1000 mL

## 2024-01-08 MED ORDER — ONDANSETRON HCL 4 MG/2ML IJ SOLN
INTRAMUSCULAR | Status: AC
  Filled 2024-01-08: qty 2

## 2024-01-08 MED ORDER — LACTATED RINGERS IV SOLN
INTRAVENOUS | Status: DC
Start: 2024-01-08 — End: 2024-01-08

## 2024-01-08 MED ORDER — MIDAZOLAM HCL 2 MG/2ML IJ SOLN
INTRAMUSCULAR | Status: AC
  Filled 2024-01-08: qty 2

## 2024-01-08 MED ORDER — FENTANYL CITRATE (PF) 250 MCG/5ML IJ SOLN
INTRAMUSCULAR | Status: DC | PRN
  Administered 2024-01-08 (×5): 50 ug via INTRAVENOUS

## 2024-01-08 MED ORDER — PROPOFOL 10 MG/ML IV BOLUS
INTRAVENOUS | Status: AC
  Filled 2024-01-08: qty 20

## 2024-01-08 MED ORDER — CEFAZOLIN SODIUM-DEXTROSE 2-4 GM/100ML-% IV SOLN
2.0000 g | INTRAVENOUS | Status: AC
  Administered 2024-01-08: 2 g via INTRAVENOUS
  Filled 2024-01-08: qty 100

## 2024-01-08 MED ORDER — DEXAMETHASONE SODIUM PHOSPHATE 10 MG/ML IJ SOLN
INTRAMUSCULAR | Status: DC | PRN
  Administered 2024-01-08: 10 mg via INTRAVENOUS

## 2024-01-08 MED ORDER — OXYCODONE HCL 5 MG/5ML PO SOLN
5.0000 mg | Freq: Once | ORAL | Status: DC | PRN
Start: 2024-01-08 — End: 2024-01-08

## 2024-01-08 MED ORDER — PHENYLEPHRINE 80 MCG/ML (10ML) SYRINGE FOR IV PUSH (FOR BLOOD PRESSURE SUPPORT)
PREFILLED_SYRINGE | INTRAVENOUS | Status: AC
  Filled 2024-01-08: qty 10

## 2024-01-08 MED ORDER — SODIUM CHLORIDE 0.9 % IR SOLN
Status: DC | PRN
  Administered 2024-01-08: 6000 mL

## 2024-01-08 MED ORDER — MIDAZOLAM HCL 2 MG/2ML IJ SOLN
INTRAMUSCULAR | Status: DC | PRN
  Administered 2024-01-08: 2 mg via INTRAVENOUS

## 2024-01-08 MED ORDER — METHOCARBAMOL 500 MG PO TABS: 500.0000 mg | ORAL_TABLET | Freq: Three times a day (TID) | ORAL | 1 refills | Status: DC | PRN

## 2024-01-08 MED ORDER — ONDANSETRON HCL 4 MG/2ML IJ SOLN
INTRAMUSCULAR | Status: DC | PRN
  Administered 2024-01-08: 4 mg via INTRAVENOUS

## 2024-01-08 MED ORDER — EPHEDRINE SULFATE-NACL 50-0.9 MG/10ML-% IV SOSY
PREFILLED_SYRINGE | INTRAVENOUS | Status: DC | PRN
  Administered 2024-01-08 (×2): 10 mg via INTRAVENOUS
  Administered 2024-01-08: 5 mg via INTRAVENOUS

## 2024-01-08 MED ORDER — PHENYLEPHRINE HCL-NACL 20-0.9 MG/250ML-% IV SOLN
INTRAVENOUS | Status: DC | PRN
  Administered 2024-01-08: 40 ug/min via INTRAVENOUS

## 2024-01-08 MED ORDER — OXYCODONE HCL 5 MG PO TABS: 5.0000 mg | ORAL_TABLET | Freq: Once | ORAL | Status: DC | PRN

## 2024-01-08 MED ORDER — ROCURONIUM BROMIDE 10 MG/ML (PF) SYRINGE
PREFILLED_SYRINGE | INTRAVENOUS | Status: DC | PRN
  Administered 2024-01-08: 70 mg via INTRAVENOUS
  Administered 2024-01-08: 5 mg via INTRAVENOUS

## 2024-01-08 MED ORDER — ORAL CARE MOUTH RINSE: 15.0000 mL | Freq: Once | OROMUCOSAL | Status: AC

## 2024-01-08 MED ORDER — FENTANYL CITRATE (PF) 100 MCG/2ML IJ SOLN: 25.0000 ug | INTRAMUSCULAR | Status: DC | PRN

## 2024-01-08 MED ORDER — DEXMEDETOMIDINE HCL IN NACL 80 MCG/20ML IV SOLN
INTRAVENOUS | Status: AC
  Filled 2024-01-08: qty 20

## 2024-01-08 MED ORDER — OXYCODONE HCL 5 MG PO TABS: 5.0000 mg | ORAL_TABLET | ORAL | 0 refills | Status: DC | PRN

## 2024-01-08 MED ORDER — BUPIVACAINE LIPOSOME 1.3 % IJ SUSP: INTRAMUSCULAR | Status: DC | PRN

## 2024-01-08 MED ORDER — ALBUTEROL SULFATE HFA 108 (90 BASE) MCG/ACT IN AERS
INHALATION_SPRAY | RESPIRATORY_TRACT | Status: AC
  Filled 2024-01-08: qty 6.7

## 2024-01-08 MED ORDER — ACETAMINOPHEN 160 MG/5ML PO SOLN: 1000.0000 mg | Freq: Once | ORAL | Status: DC | PRN

## 2024-01-08 MED ORDER — LIDOCAINE 2% (20 MG/ML) 5 ML SYRINGE
INTRAMUSCULAR | Status: AC
  Filled 2024-01-08: qty 5

## 2024-01-08 MED ORDER — BUPIVACAINE LIPOSOME 1.3 % IJ SUSP
INTRAMUSCULAR | Status: DC | PRN
  Administered 2024-01-08: 133 mg via PERINEURAL

## 2024-01-08 MED ORDER — LIDOCAINE 2% (20 MG/ML) 5 ML SYRINGE
INTRAMUSCULAR | Status: DC | PRN
  Administered 2024-01-08: 80 mg via INTRAVENOUS

## 2024-01-08 MED ORDER — ROCURONIUM BROMIDE 10 MG/ML (PF) SYRINGE
PREFILLED_SYRINGE | INTRAVENOUS | Status: AC
  Filled 2024-01-08: qty 10

## 2024-01-08 MED ORDER — SUGAMMADEX SODIUM 200 MG/2ML IV SOLN
INTRAVENOUS | Status: DC | PRN
  Administered 2024-01-08: 200 mg via INTRAVENOUS

## 2024-01-08 MED ORDER — PHENYLEPHRINE 80 MCG/ML (10ML) SYRINGE FOR IV PUSH (FOR BLOOD PRESSURE SUPPORT)
PREFILLED_SYRINGE | INTRAVENOUS | Status: DC | PRN
  Administered 2024-01-08: 240 ug via INTRAVENOUS
  Administered 2024-01-08 (×4): 80 ug via INTRAVENOUS
  Administered 2024-01-08 (×2): 160 ug via INTRAVENOUS

## 2024-01-08 MED ORDER — CHLORHEXIDINE GLUCONATE 0.12 % MT SOLN
15.0000 mL | Freq: Once | OROMUCOSAL | Status: AC
  Administered 2024-01-08: 15 mL via OROMUCOSAL
  Filled 2024-01-08: qty 15

## 2024-01-08 MED ORDER — ACETAMINOPHEN 10 MG/ML IV SOLN: 1000.0000 mg | Freq: Once | INTRAVENOUS | Status: DC | PRN

## 2024-01-08 SURGICAL SUPPLY — 66 items
ANCHOR FBRTK 2.6 SUTURETAP 1.3 (Anchor) IMPLANT
ANCHOR SUT SWIVELLOK BIO (Anchor) IMPLANT
ANCHOR SWIVELOCK BIO 4.75X19.1 (Anchor) IMPLANT
BAG COUNTER SPONGE SURGICOUNT (BAG) IMPLANT
BLADE EXCALIBUR 4.0X13 (MISCELLANEOUS) IMPLANT
BLADE SHAVER TORPEDO 4X13 (MISCELLANEOUS) IMPLANT
BURR OVAL 8 FLU 4.0X13 (MISCELLANEOUS) IMPLANT
COVER SURGICAL LIGHT HANDLE (MISCELLANEOUS) ×1 IMPLANT
DRAPE INCISE IOBAN 66X45 STRL (DRAPES) ×2 IMPLANT
DRAPE STERI 35X30 U-POUCH (DRAPES) ×1 IMPLANT
DRAPE SURG ORHT 6 SPLT 77X108 (DRAPES) IMPLANT
DRAPE U-SHAPE 47X51 STRL (DRAPES) ×2 IMPLANT
DRSG TEGADERM 4X4.75 (GAUZE/BANDAGES/DRESSINGS) ×3 IMPLANT
DURAPREP 26ML APPLICATOR (WOUND CARE) ×1 IMPLANT
DW OUTFLOW CASSETTE/TUBE SET (MISCELLANEOUS) ×1 IMPLANT
ELECT REM PT RETURN 9FT ADLT (ELECTROSURGICAL) ×1 IMPLANT
ELECTRODE REM PT RTRN 9FT ADLT (ELECTROSURGICAL) ×1 IMPLANT
GAUZE SPONGE 4X4 12PLY STRL (GAUZE/BANDAGES/DRESSINGS) ×1 IMPLANT
GAUZE SPONGE 4X4 12PLY STRL LF (GAUZE/BANDAGES/DRESSINGS) ×1 IMPLANT
GAUZE XEROFORM 1X8 LF (GAUZE/BANDAGES/DRESSINGS) IMPLANT
GLOVE BIOGEL PI IND STRL 7.0 (GLOVE) ×1 IMPLANT
GLOVE BIOGEL PI IND STRL 8 (GLOVE) ×1 IMPLANT
GLOVE ECLIPSE 7.0 STRL STRAW (GLOVE) ×1 IMPLANT
GLOVE ECLIPSE 8.0 STRL XLNG CF (GLOVE) ×1 IMPLANT
GOWN STRL REUS W/ TWL LRG LVL3 (GOWN DISPOSABLE) ×3 IMPLANT
GRAFT TISS 20X25 1 THK DERM (Tissue) IMPLANT
KIT BASIN OR (CUSTOM PROCEDURE TRAY) ×1 IMPLANT
KIT TURNOVER KIT B (KITS) ×1 IMPLANT
MANIFOLD NEPTUNE II (INSTRUMENTS) ×1 IMPLANT
NDL HD SCORPION MEGA LOADER (NEEDLE) IMPLANT
NDL SCORPION MULTI FIRE (NEEDLE) IMPLANT
NDL SPNL 18GX3.5 QUINCKE PK (NEEDLE) ×1 IMPLANT
NDL SUT 6 .5 CRC .975X.05 MAYO (NEEDLE) IMPLANT
NDL TAPERED W/ NITINOL LOOP (MISCELLANEOUS) IMPLANT
NEEDLE SCORPION MULTI FIRE (NEEDLE) IMPLANT
NEEDLE SPNL 18GX3.5 QUINCKE PK (NEEDLE) ×1 IMPLANT
NEEDLE TAPERED W/ NITINOL LOOP (MISCELLANEOUS) ×1 IMPLANT
NS IRRIG 1000ML POUR BTL (IV SOLUTION) ×1 IMPLANT
PACK SHOULDER (CUSTOM PROCEDURE TRAY) ×1 IMPLANT
PAD ARMBOARD 7.5X6 YLW CONV (MISCELLANEOUS) ×2 IMPLANT
PROBE APOLLO 90XL (SURGICAL WAND) ×1 IMPLANT
RESTRAINT HEAD UNIVERSAL NS (MISCELLANEOUS) ×1 IMPLANT
SLEEVE SCD COMPRESS KNEE MED (STOCKING) IMPLANT
SLING ARM IMMOBILIZER LRG (SOFTGOODS) IMPLANT
SPONGE T-LAP 4X18 ~~LOC~~+RFID (SPONGE) ×2 IMPLANT
STRIP CLOSURE SKIN 1/2X4 (GAUZE/BANDAGES/DRESSINGS) ×1 IMPLANT
SUCTION TUBE FRAZIER 10FR DISP (SUCTIONS) ×1 IMPLANT
SUT 0 FIBERLOOP 38 BLUE TPR ND (SUTURE) ×1 IMPLANT
SUT ETHILON 3 0 PS 1 (SUTURE) ×1 IMPLANT
SUT FIBERWIRE #2 38 T-5 BLUE (SUTURE) IMPLANT
SUT MNCRL AB 3-0 PS2 18 (SUTURE) ×1 IMPLANT
SUT VIC AB 0 CT1 27XBRD ANBCTR (SUTURE) ×1 IMPLANT
SUT VIC AB 1 CT1 27XBRD ANBCTR (SUTURE) IMPLANT
SUT VIC AB 2-0 CT1 TAPERPNT 27 (SUTURE) ×1 IMPLANT
SUT VICRYL 0 UR6 27IN ABS (SUTURE) ×1 IMPLANT
SUT VICRYL 1 TIES 12X18 (SUTURE) ×1 IMPLANT
SUTURE 0 FIBERLP 38 BLU TPR ND (SUTURE) IMPLANT
SUTURE FIBERWR #2 38 T-5 BLUE (SUTURE) IMPLANT
SUTURE TAPE 1.3 FIBERLOP 20 ST (SUTURE) IMPLANT
SUTURETAPE 1.3 FIBERLOOP 20 ST (SUTURE) IMPLANT
TISSUE ARTHROFLEX THICK 4 (Tissue) ×1 IMPLANT
TOWEL GREEN STERILE (TOWEL DISPOSABLE) ×1 IMPLANT
TOWEL GREEN STERILE FF (TOWEL DISPOSABLE) ×1 IMPLANT
TUBING ARTHROSCOPY IRRIG 16FT (MISCELLANEOUS) ×1 IMPLANT
WAND ABLATOR APOLLO I90 (BUR) IMPLANT
WATER STERILE IRR 1000ML POUR (IV SOLUTION) ×1 IMPLANT

## 2024-01-08 NOTE — Brief Op Note (Signed)
   01/08/2024  10:37 AM  PATIENT:  Rene Finger  63 y.o. female  PRE-OPERATIVE DIAGNOSIS:  right shoulder rotator cuff tear  POST-OPERATIVE DIAGNOSIS:  right shoulder rotator cuff tear  PROCEDURE:  Procedure(s): RIGHT SHOULDER ARTHROSCOPY, DEBRIDEMENT,  OPEN cuff mend augmented ROTATOR CUFF TEAR REPAIR  SURGEON:  Surgeon(s): Addie, Cordella Hamilton, MD  ASSISTANT: magnant pa  ANESTHESIA:   general  EBL: 10 ml    Total I/O In: 100 [IV Piggyback:100] Out: -   BLOOD ADMINISTERED: none  DRAINS: none   LOCAL MEDICATIONS USED:  none  SPECIMEN:  No Specimen  COUNTS:  YES  TOURNIQUET:  * No tourniquets in log *  DICTATION: .Other Dictation: Dictation Number [id number]  PLAN OF CARE: Discharge to home after PACU  PATIENT DISPOSITION:  PACU - hemodynamically stable

## 2024-01-08 NOTE — Anesthesia Procedure Notes (Signed)
 Procedure Name: Intubation Date/Time: 01/08/2024 7:55 AM  Performed by: Roslynn Waddell LABOR, CRNAPre-anesthesia Checklist: Patient identified, Emergency Drugs available, Suction available and Patient being monitored Patient Re-evaluated:Patient Re-evaluated prior to induction Oxygen Delivery Method: Circle System Utilized Preoxygenation: Pre-oxygenation with 100% oxygen Induction Type: IV induction Ventilation: Oral airway inserted - appropriate to patient size Laryngoscope Size: Mac and 3 Tube type: Oral Tube size: 7.0 mm Number of attempts: 1 Airway Equipment and Method: Stylet and Oral airway Placement Confirmation: ETT inserted through vocal cords under direct vision, positive ETCO2 and breath sounds checked- equal and bilateral Secured at: 21 cm Tube secured with: Tape Dental Injury: Teeth and Oropharynx as per pre-operative assessment  Comments: Atraumatic induction/intubation. Dentition and oral mucosa as per preop

## 2024-01-08 NOTE — Anesthesia Preprocedure Evaluation (Signed)
 Anesthesia Evaluation  Patient identified by MRN, date of birth, ID band Patient awake    Reviewed: Allergy & Precautions, NPO status , Patient's Chart, lab work & pertinent test results  History of Anesthesia Complications Negative for: history of anesthetic complications  Airway Mallampati: II  TM Distance: >3 FB Neck ROM: Full    Dental  (+) Teeth Intact, Dental Advisory Given   Pulmonary neg shortness of breath, asthma , neg sleep apnea, neg COPD, neg recent URI   breath sounds clear to auscultation       Cardiovascular hypertension, Pt. on medications (-) angina (-) Past MI and (-) CHF  Rhythm:Regular     Neuro/Psych neg Seizures PSYCHIATRIC DISORDERS Anxiety     CVA, No Residual Symptoms    GI/Hepatic Neg liver ROS,,,  Endo/Other    Renal/GU negative Renal ROS     Musculoskeletal  (+) Arthritis ,    Abdominal   Peds  Hematology negative hematology ROS (+) Lab Results      Component                Value               Date                      WBC                      4.0                 03/04/2015                HGB                      13.8                03/04/2015                HCT                      39.8                03/04/2015                MCV                      96.1                03/04/2015                PLT                      112 (L)             03/04/2015              Anesthesia Other Findings   Reproductive/Obstetrics                              Anesthesia Physical Anesthesia Plan  ASA: 3  Anesthesia Plan: General   Post-op Pain Management: Toradol IV (intra-op)*, Ofirmev  IV (intra-op)* and Regional block*   Induction: Intravenous  PONV Risk Score and Plan: 4 or greater and Ondansetron  and Dexamethasone   Airway Management Planned: Oral ETT  Additional Equipment: None  Intra-op Plan:   Post-operative Plan: Extubation in OR  Informed Consent: I  have reviewed the patients  History and Physical, chart, labs and discussed the procedure including the risks, benefits and alternatives for the proposed anesthesia with the patient or authorized representative who has indicated his/her understanding and acceptance.     Dental advisory given  Plan Discussed with: CRNA  Anesthesia Plan Comments:          Anesthesia Quick Evaluation

## 2024-01-08 NOTE — Op Note (Signed)
 NAMEESSENSE, BOUSQUET MEDICAL RECORD NO: [id number] ACCOUNT NO: [id number] DATE OF BIRTH: September 06, 1961 FACILITY: [facility] LOCATION: [facility] PHYSICIAN: Cordella RAMAN. Addie, MD  Operative Report   DATE OF PROCEDURE: 01/08/2024  PREOPERATIVE DIAGNOSES:  Right shoulder massive rotator cuff tear and biceps tendon rupture with residual degenerative biceps tendon and superior labrum.  POSTOPERATIVE DIAGNOSES:  Right shoulder massive rotator cuff tear and biceps tendon rupture with residual degenerative biceps tendon and superior labrum.  PROCEDURE:  Right shoulder arthroscopy with extensive debridement of the remnant biceps tendon, superior labrum, and synovitis within the rotator interval with subsequent open rotator cuff tear repair and acromioplasty using anchors and Arthrex CuffMend  patch.  SURGEON:  Cordella RAMAN. Addie, MD  ASSISTANT:  Herlene Calix.  INDICATIONS:  This is a 63 year old patient with massive rotator cuff tear who presents for operative management after explanation of the risks and benefits.  DESCRIPTION OF PROCEDURE:  The patient was brought to the operating room where general endotracheal anesthesia was induced.  Preoperative antibiotics administered.  Timeout was called.  The patient was placed in the beach chair position with the head in  neutral position.  The right shoulder, arm, and hand prescrubbed with alcohol and Betadine allowed to air dry and prepped with DuraPrep solution and draped in a sterile manner.  Axilla covered and the operative field was sealed.  Preop examination under  anesthesia demonstrated range of motion of 70/100/170.  Posterior portal was created 2 cm medial and inferior to the posterolateral margin of the acromion.  Diagnostic arthroscopy was performed.  The patient had a large rotator cuff tear along with  significant biceps remnant tendon in the joint from her biceps tendon rupture about 3 weeks ago.  Superior labrum also was degenerated from around the 10  o'clock to 2 o'clock position.  There was also significant synovitis within the rotator interval as  well as in the posterosuperior rotator cuff capsule region.  After creating an anterior portal under direct visualization, an extensive debridement was performed of the remnant biceps tendon, which was frayed.  The superior labrum was also debrided and  the synovitis was also debrided in the posterosuperior rotator cuff region as well as within the rotator interval.  Intraarticular subscapularis was intact.  Glenohumeral articular surface was also intact.  Rotator cuff tear involved the infraspinatus  and supraspinatus in a U-shaped pattern, but did have mobility.  Deemed repairable.  At this time, after extensive debridement of intraarticular structures, the instruments were removed and the portals were closed using 3-0 nylon.  Ioban then used to  cover the entire operative field.  A small incision off the anterolateral margin of the acromion was made and the deltoid was split between the anterior middle raphe and measured distance of 4 cm marked with a #1 Vicryl suture.  At this time, bursectomy  was performed.  Self-retaining retractor was placed.  The rotator cuff tendon was visualized and it involved the supraspinatus and infraspinatus.  We used 7-0 Vicryl traction sutures after debriding the leading edge of the rotator cuff tendon.  Those  traction sutures along with coracohumeral ligament release allowed for mobilization of the tendon back to its attachment site.  Next, the greater tuberosity footprint was prepared with a rongeur.  Acromioplasty was performed with a rasp.  The three  Arthrex SutureTape medial row anchors were placed.  The rotator cuff was mobilized back to its attachment site and using the Scorpion suture passer, these 12 SutureTapes were passed from posterior to anterior.  Next, we incorporated a CuffMend graft.   The CuffMend graft was placed through the suture tapes x8 on the medial  aspect.  At this time, with the rotator cuff reduced from the Vicryl traction sutures, the SutureTapes were tied with the medial edge incorporation of the CuffMend graft.  Next, the  arm was abducted and the Vicryl sutures were placed into a 6.5 corkscrew.  Bone quality was poor initially, which negated ability to use the SwiveLock.  Instead, we used the bigger corkscrew type SwiveLock, which was 6.5, a little bit more distal in the  metaphysis and better bone and that did allow for secure fixation of the Vicryl sutures.  Next, with the rotator cuff tendon reduced, the patch was then sutured over the tendon to residual tendon on each lateral corner.  This was done with 0 Vicryl  suture.  This gave a very nice stretch and very nice coverage of the CuffMend patch over the rotator cuff tendon.  Next, the SutureTapes were tied and crossed and six limb was placed in a posterior corkscrew and six sutures placed anteriorly into the  bicipital groove.  This gave a very nice watertight repair and the 6.5 Corkscrew type anchor to allow for nice compression of the sutures.  At this time, thorough irrigation was performed.  Good decompression was achieved.  Watertight repair was also  achieved with very good tension of the patch on the rotator cuff tendon.  One accessory 0 FiberWire suture was placed in each end of that graft in order to allow for non-resorbable suture fixation of the lateral portion of the graft to surrounding  tissue.  Next, the self-retaining retractor was removed.  The deltoid split was closed using #1 Vicryl suture followed by interrupted inverted 0 Vicryl suture, 2-0 Vicryl suture, and 3-0 Monocryl with Steri-Strips and impervious dressings applied.  The  patient tolerated the procedure well without immediate complications.  Shoulder immobilizer placed.  Luke's assistance was required for opening and closing, mobilization of tissue.  His assistance was of medical necessity.   PUS D: 01/08/2024  10:45:22 am T: 01/08/2024 11:40:00 am  JOB: [id number]/ [id number]

## 2024-01-08 NOTE — H&P (Signed)
 Sophia Salazar is an 63 y.o. female.   Chief Complaint: right shoulder pain  HPI: Chairty Salazar is a 63 y.o. female who presents treporting right shoulder pain.  Patient reports chronic pain for the past 2 to 3 years.  Has become worse during that time.  She is right-hand dominant.  Pain wakes her from sleep at night.  The pain does radiate to the forearm.  Describes episodic numbness and tingling as well as some neck pain.  Denies any scapular pain.  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  Currently unemployed.  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  Has been relatively asymptomatic for her since that nonunion.  She does describe popping and grinding in the shoulder region.  She also describes a snapping sensation which resulted in decreased function for several days.  Clearly localizes this more to the deltoid region and not really the clavicular region.   Patient also describes a history of neck pain and radiculopathy.  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  She went to a chiropractor at that time with traction therapy employed and significant relief of symptoms.  Never did have cervical spine ESI or surgery which was recommended at the time.   Patient does have an MRI scan of the shoulder which is reviewed with her.  Shows complete tear of the supraspinatus tendon with 3.1 cm of retraction.  Atrophy is present in the supraspinatus muscle belly.  There is also a ganglion cyst within the infraspinatus muscle.  There is also moderate tendinosis of the biceps tendon..Pt also had biceps rupture in December which was spontaneous on the right. Those sxs are resolving.    Past Medical History:  Diagnosis Date   Anxiety    Arthritis    Asthma    History of kidney stones 2024   Hypertension    Stroke (HCC)     1994; no peripheral vision    Past Surgical History:  Procedure Laterality Date   COSMETIC SURGERY Left    on R arm   TUBAL LIGATION      No family history on file. Social History:  reports that she has never smoked. She has never used smokeless tobacco. She reports current alcohol use of about 1.0 standard drink of alcohol per week. She reports that she does not use drugs.  Allergies: No Known Allergies  Medications Prior to Admission  Medication Sig Dispense Refill   acetaminophen  (TYLENOL ) 500 MG tablet Take 1 tablet (500 mg total) by mouth every 6 (six) hours as needed. 30 tablet 0   acetaminophen  (TYLENOL ) 650 MG CR tablet Take 1,300 mg by mouth daily.     albuterol  (PROVENTIL  HFA;VENTOLIN  HFA) 108 (90 BASE) MCG/ACT inhaler Inhale 1 puff into the lungs every 6 (six) hours as needed for wheezing or shortness of breath.     amLODipine (NORVASC) 2.5 MG tablet Take 2.5 mg by mouth at bedtime.     budesonide -formoterol (SYMBICORT) 160-4.5 MCG/ACT inhaler Inhale 1 puff into the lungs daily.     Chlorpheniramine-Phenylephrine  4-10 MG per tablet Take 1 tablet by mouth every 4 (four) hours as needed for congestion.     diclofenac Sodium (VOLTAREN) 1 % GEL Apply 1 Application topically 3 (three) times daily as needed (cramps).     estradiol (ESTRACE) 0.1  MG/GM vaginal cream Place 1 Applicatorful vaginally daily as needed (atrophy).     hydrOXYzine (ATARAX) 25 MG tablet Take 25 mg by mouth at bedtime as needed for anxiety.     IODINE, KELP, PO Take 325 mcg by mouth daily.     ketotifen (ZADITOR) 0.035 % ophthalmic solution Place 1 drop into both eyes daily.     levocetirizine (XYZAL) 5 MG tablet Take 5 mg by mouth every evening.     lisinopril (ZESTRIL) 40 MG tablet Take 40 mg by mouth daily.     loratadine  (CLARITIN ) 10 MG tablet Take 10 mg by mouth daily.     MAGNESIUM -OXIDE 400 (240 Mg) MG tablet Take 400 mg by mouth daily.     meloxicam (MOBIC) 15 MG tablet Take 15 mg by mouth daily.      Misc Natural Products (HORNY GOAT WEED) CAPS Take 1 capsule by mouth daily as needed (recreational purposes).     Multiple Vitamin (MULTIVITAMIN PO) Take 1 tablet by mouth daily.     OVER THE COUNTER MEDICATION Take 1 tablet by mouth at bedtime as needed (leg cramps). Hyland's Leg Cramp     Propylene Glycol (SYSTANE COMPLETE) 0.6 % SOLN Place 1 drop into both eyes daily.      No results found for this or any previous visit (from the past 48 hours). No results found.  Review of Systems  Musculoskeletal:  Positive for arthralgias.  All other systems reviewed and are negative.   Blood pressure (!) 128/92, pulse (!) 114, temperature 98.2 F (36.8 C), temperature source Oral, resp. rate 18, height 5' 6 (1.676 m), weight 73.9 kg, SpO2 98%. Physical Exam Vitals reviewed.  HENT:     Head: Normocephalic.     Nose: Nose normal.     Mouth/Throat:     Mouth: Mucous membranes are moist.  Eyes:     Pupils: Pupils are equal, round, and reactive to light.  Cardiovascular:     Rate and Rhythm: Normal rate.     Pulses: Normal pulses.  Pulmonary:     Effort: Pulmonary effort is normal.  Abdominal:     General: Abdomen is flat.  Musculoskeletal:     Cervical back: Normal range of motion.  Skin:    General: Skin is warm.     Capillary Refill: Capillary refill takes less than 2 seconds.  Neurological:     General: No focal deficit present.     Mental Status: She is alert.  Psychiatric:        Mood and Affect: Mood normal.    Ortho exam demonstrates active forward flexion and abduction on the right greater than 90 degrees.  Passive range of motion bilaterally is 60/105/160.  Definitely more painful on the right than the left.  She has 5+ out of 5 external rotation strength on the left compared to 5 out of 5 on the right.  Subscap strength 5+ out of 5 bilaterally.  Bicipital groove tenderness is present. Popeye deformity present. 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  With passive arm range of motion I do not feel a lot of crepitus or grinding around that midshaft of the clavicle.  She does have some popping in the shoulder region.  No masses lymphadenopathy or skin changes noted in that shoulder girdle region.    Assessment/Plan Impression is right shoulder pain with complex problems going on in that right upper extremity.  In  general she does have some symptoms which could be radicular in nature.  Surprisingly no left-sided symptoms which was definitely her worst side on MRI scanning 2 years ago.  Not really reporting much in the way of dorsal hand numbness.  I think the neck is a partial pain generator contributor but not the major 1.   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  Not particular tender to palpation and she does not really localize much of her symptoms to that clavicular region.  This could be addressed surgically but she has been living with it for about 10 years and I think it is more likely that more of her symptoms are coming from the shoulder.   Regarding the shoulder she does have a retracted rotator cuff tear.  Biceps tendon is likely involved as well but has recently ruptured..  Not too keen on injections.  I think surgery could be considered with arthroscopy and an attempt at rotator cuff repair.  In her case we would almost certainly have to augment with the patch.  That may relieve some of her symptoms.  Based on how much pain she is having today in general with passive range of motion I think the shoulder is the likely culprit.  Does not appear to be frozen based on maintenance of passive range of motion.  The risks and benefits of surgical intervention are discussed with the patient including but not limited to infection nerve and vessel damage incomplete resolution of pain as well as incomplete functional return.  I think is possible that she may require cervical spine  diagnostic injections in the future.  All questions answered.  KANDICE Glendia Hutchinson, MD 01/08/2024, 6:49 AM

## 2024-01-08 NOTE — Transfer of Care (Signed)
 Immediate Anesthesia Transfer of Care Note  Patient: Sophia Salazar  Procedure(s) Performed: RIGHT SHOULDER ARTHROSCOPY, DEBRIDEMENT, MINI OPEN BICEP TENODESIS AND ROTATOR CUFF TEAR REPAIR (Right)  Patient Location: PACU  Anesthesia Type:General; Regional  Level of Consciousness: awake, alert , and patient cooperative  Airway & Oxygen Therapy: Patient Spontanous Breathing and Patient connected to face mask oxygen  Post-op Assessment: Report given to RN and Post -op Vital signs reviewed and stable  Post vital signs: Reviewed and stable  Last Vitals:  Vitals Value Taken Time  BP 137/88 01/08/24 1049  Temp    Pulse 107 01/08/24 1052  Resp 19 01/08/24 1052  SpO2 96 % 01/08/24 1052  Vitals shown include unfiled device data.  Last Pain:  Vitals:   01/08/24 0652  TempSrc:   PainSc: 8       Patients Stated Pain Goal: 0 (01/08/24 9347)  Complications: No notable events documented.

## 2024-01-08 NOTE — Anesthesia Procedure Notes (Signed)
 Anesthesia Regional Block: Interscalene brachial plexus block   Pre-Anesthetic Checklist: , timeout performed,  Correct Patient, Correct Site, Correct Laterality,  Correct Procedure, Correct Position, site marked,  Risks and benefits discussed,  Surgical consent,  Pre-op evaluation,  At surgeon's request and post-op pain management  Laterality: Right and Upper  Prep: chloraprep       Needles:  Injection technique: Single-shot      Needle Length: 5cm  Needle Gauge: 22     Additional Needles: Arrow StimuQuik ECHO Echogenic Stimulating PNB Needle  Procedures:,,,, ultrasound used (permanent image in chart),,    Narrative:  Start time: 01/08/2024 7:18 AM End time: 01/08/2024 7:25 AM Injection made incrementally with aspirations every 5 mL.  Performed by: Personally  Anesthesiologist: Leopoldo Bruckner, MD

## 2024-01-09 ENCOUNTER — Encounter (HOSPITAL_COMMUNITY): Payer: Self-pay | Admitting: Orthopedic Surgery

## 2024-01-09 NOTE — Anesthesia Postprocedure Evaluation (Signed)
 Anesthesia Post Note  Patient: Dema Timmons  Procedure(s) Performed: RIGHT SHOULDER ARTHROSCOPY, DEBRIDEMENT, MINI OPEN BICEP TENODESIS AND ROTATOR CUFF TEAR REPAIR (Right)     Patient location during evaluation: PACU Anesthesia Type: General and Regional Level of consciousness: awake and alert Pain management: pain level controlled Vital Signs Assessment: post-procedure vital signs reviewed and stable Respiratory status: spontaneous breathing, nonlabored ventilation and respiratory function stable Cardiovascular status: blood pressure returned to baseline and stable Postop Assessment: no apparent nausea or vomiting Anesthetic complications: no   No notable events documented.  Last Vitals:  Vitals:   01/08/24 1115 01/08/24 1130  BP: 128/85 130/89  Pulse: (!) 109 (!) 105  Resp: 19 19  Temp:  36.8 C  SpO2: 94% 96%    Last Pain:  Vitals:   01/08/24 1051  TempSrc:   PainSc: 0-No pain                 Iaan Oregel

## 2024-01-16 ENCOUNTER — Encounter: Payer: Self-pay | Admitting: Surgical

## 2024-01-16 ENCOUNTER — Ambulatory Visit (INDEPENDENT_AMBULATORY_CARE_PROVIDER_SITE_OTHER): Payer: Medicaid Other | Admitting: Surgical

## 2024-01-16 DIAGNOSIS — M75121 Complete rotator cuff tear or rupture of right shoulder, not specified as traumatic: Secondary | ICD-10-CM

## 2024-01-16 NOTE — Progress Notes (Signed)
Post-Op Visit Note   Patient: Sophia Salazar           Date of Birth: [birth year]           MRN: [id number] Visit Date: 01/16/2024 PCP: [facility]   Assessment & Plan:  Chief Complaint:  Chief Complaint  Patient presents with   Right Shoulder - Follow-up    RIGHT SHOULDER ARTHROSCOPY, DEBRIDEMENT, MINI OPEN BICEP TENODESIS AND ROTATOR CUFF TEAR REPAIR 01/08/2024   Visit Diagnoses:  1. Complete tear of right rotator cuff, unspecified whether traumatic     Plan: Sophia Salazar is a 63 y.o. female who presents s/p right shoulder rotator cuff repair with cuff mend augmentation on 01/08/2024.  Patient is doing well and pain is overall controlled.  Not using CPM machine as this was too expensive..  Denies any chest pain, SOB, fevers, chills.  She feels fairly self-sufficient.  She has been staying in the sling with no lifting of the arm.  Taking Tylenol or tramadol depending on how her pain is doing.  She is able to sleep 3 to 4 hours at a time.  On exam, patient has range of motion 30 degrees X rotation, 60 degrees abduction, 80 degrees forward elevation passively.  Intact EPL, FPL, finger abduction, finger adduction, pronation/supination, bicep, tricep, deltoid of operative extremity.  Axillary nerve intact with deltoid firing.  Incisions are healing well without evidence of infection or dehiscence.  Sutures removed and replaced with Steri-Strips today.  2+ radial pulse of the operative extremity  Plan is start pendulum exercises and elbow range of motion exercises today as she will do 2-3 times throughout the day before returning to the sling.  These were demonstrated for her in the clinic today.  She was specifically cautioned against any active range of motion of the operative shoulder or any lifting with the operative arm.  We will see her back in 2 weeks for clinical recheck and likely getting her set up for physical therapy at that time.  Follow-up with Dr. August Saucer at that  point..   Follow-Up Instructions: No follow-ups on file.   Orders:  No orders of the defined types were placed in this encounter.  No orders of the defined types were placed in this encounter.   Imaging: No results found.  PMFS History: There are no active problems to display for this patient.  Past Medical History:  Diagnosis Date   Anxiety    Arthritis    Asthma    History of kidney stones 2024   Hypertension    Stroke (HCC)    1994; no peripheral vision    No family history on file.  Past Surgical History:  Procedure Laterality Date   COSMETIC SURGERY Left    on R arm   SHOULDER ARTHROSCOPY WITH SUBACROMIAL DECOMPRESSION, ROTATOR CUFF REPAIR AND BICEP TENDON REPAIR Right 01/08/2024   Procedure: RIGHT SHOULDER ARTHROSCOPY, DEBRIDEMENT, MINI OPEN BICEP TENODESIS AND ROTATOR CUFF TEAR REPAIR;  Surgeon: Cammy Copa, MD;  Location: [facility] OR;  Service: Orthopedics;  Laterality: Right;   TUBAL LIGATION     Social History   Occupational History   Not on file  Tobacco Use   Smoking status: Never   Smokeless tobacco: Never  Vaping Use   Vaping status: Never Used  Substance and Sexual Activity   Alcohol use: Yes    Alcohol/week: 1.0 standard drink of alcohol    Types: 1 Glasses of wine per week   Drug use: No  Sexual activity: Not on file

## 2024-01-18 ENCOUNTER — Ambulatory Visit: Payer: 59 | Admitting: Orthopedic Surgery

## 2024-01-21 ENCOUNTER — Other Ambulatory Visit: Payer: Self-pay | Admitting: Surgical

## 2024-01-21 ENCOUNTER — Telehealth: Payer: Self-pay | Admitting: Radiology

## 2024-01-21 MED ORDER — HYDROCODONE-ACETAMINOPHEN 5-325 MG PO TABS: 1.0000 | ORAL_TABLET | ORAL | 0 refills | Status: DC | PRN

## 2024-01-21 MED ORDER — ONDANSETRON HCL 4 MG PO TABS: 4.0000 mg | ORAL_TABLET | Freq: Three times a day (TID) | ORAL | 0 refills | Status: DC | PRN

## 2024-01-21 NOTE — Telephone Encounter (Signed)
noted

## 2024-01-21 NOTE — Telephone Encounter (Signed)
Sent in RX for Norco to take instead of oxycodone

## 2024-01-21 NOTE — Telephone Encounter (Signed)
FYI  Patient called triage line this morning with questions. She is status post RIGHT SHOULDER ARTHROSCOPY, DEBRIDEMENT, MINI OPEN BICEP TENODESIS AND ROTATOR CUFF TEAR REPAIR on 01/08/2024.  She states that she has been having nausea and vomiting daily since surgery.  She also says that she was doing well, however, she has had increased pain, mostly when she is laying down. She denies any fever, chills, redness, or swelling. She has not re-injured herself. She has only taken three oxycodone tablets and is using tylenol.  Patient aware pain, as well as pain medication can make her feel nauseated. Explained that she had an extensive surgery and pain is not uncommon at only 2 weeks post op. She thinks that she is mostly scared because she does not know what to expect since she has never had surgery before. She will continue to monitor her symptoms, and let us know if any increased problems.

## 2024-01-21 NOTE — Telephone Encounter (Signed)
Thanks for talking with her. I sent in some Zofran to her pharmacy to see if this will help. We could also try a different pain medication to see if she has less nausea with a different med

## 2024-01-24 DIAGNOSIS — S46211S Strain of muscle, fascia and tendon of other parts of biceps, right arm, sequela: Secondary | ICD-10-CM

## 2024-01-24 DIAGNOSIS — M75121 Complete rotator cuff tear or rupture of right shoulder, not specified as traumatic: Secondary | ICD-10-CM

## 2024-01-24 DIAGNOSIS — S43431A Superior glenoid labrum lesion of right shoulder, initial encounter: Secondary | ICD-10-CM

## 2024-01-24 DIAGNOSIS — M65911 Unspecified synovitis and tenosynovitis, right shoulder: Secondary | ICD-10-CM

## 2024-02-04 ENCOUNTER — Ambulatory Visit (INDEPENDENT_AMBULATORY_CARE_PROVIDER_SITE_OTHER): Payer: Medicaid Other | Admitting: Orthopedic Surgery

## 2024-02-04 ENCOUNTER — Encounter: Payer: Self-pay | Admitting: Orthopedic Surgery

## 2024-02-04 DIAGNOSIS — M75121 Complete rotator cuff tear or rupture of right shoulder, not specified as traumatic: Secondary | ICD-10-CM

## 2024-02-04 NOTE — Progress Notes (Signed)
   Post-Op Visit Note   Patient: Sophia Salazar           Date of Birth: [birth year]           MRN: [id number] Visit Date: 02/04/2024 PCP: [facility]   Assessment & Plan:  Chief Complaint:  Chief Complaint  Patient presents with   Right Shoulder - Routine Post Op    right shoulder rotator cuff repair with cuff mend augmentation on 01/08/2024.   Visit Diagnoses:  1. Complete tear of right rotator cuff, unspecified whether traumatic     Plan: Sophia Salazar is a patient who is now about 3 and half weeks out right shoulder rotator cuff tear repair with cuff mend augmentation.  She has been in a sling.  Has not started physical therapy yet.  On examination she has pretty reasonable range of motion but is apprehensive about moving the shoulder.  I would like for her to DC the sling in a week and start physical therapy this week for passive range of motion including overhead motion for 2 weeks.  Okay to start doing strengthening after 2 weeks which will be 6 weeks postop.  Copy of the op note provided.  Her external rotation strength is actually excellent today at nearly 5+ out of 5 comparable to the left-hand side.  Main goal for Sophia Salazar is range of motion.  She will follow-up in 4 weeks for clinical recheck.  Follow-Up Instructions: No follow-ups on file.   Orders:  No orders of the defined types were placed in this encounter.  No orders of the defined types were placed in this encounter.   Imaging: No results found.  PMFS History: Patient Active Problem List   Diagnosis Date Noted   Nontraumatic complete tear of right rotator cuff 01/24/2024   Synovitis of right shoulder 01/24/2024   Biceps rupture, proximal, right, sequela 01/24/2024   Degenerative superior labral anterior-to-posterior (SLAP) tear of right shoulder 01/24/2024   Past Medical History:  Diagnosis Date   Anxiety    Arthritis    Asthma    History of kidney stones 2024   Hypertension    Stroke (HCC)     1994; no peripheral vision    No family history on file.  Past Surgical History:  Procedure Laterality Date   COSMETIC SURGERY Left    on R arm   SHOULDER ARTHROSCOPY WITH SUBACROMIAL DECOMPRESSION, ROTATOR CUFF REPAIR AND BICEP TENDON REPAIR Right 01/08/2024   Procedure: RIGHT SHOULDER ARTHROSCOPY, DEBRIDEMENT, MINI OPEN BICEP TENODESIS AND ROTATOR CUFF TEAR REPAIR;  Surgeon: Cammy Copa, MD;  Location: [facility] OR;  Service: Orthopedics;  Laterality: Right;   TUBAL LIGATION     Social History   Occupational History   Not on file  Tobacco Use   Smoking status: Never   Smokeless tobacco: Never  Vaping Use   Vaping status: Never Used  Substance and Sexual Activity   Alcohol use: Yes    Alcohol/week: 1.0 standard drink of alcohol    Types: 1 Glasses of wine per week   Drug use: No   Sexual activity: Not on file

## 2024-02-06 ENCOUNTER — Telehealth: Payer: Self-pay

## 2024-02-06 NOTE — Telephone Encounter (Signed)
-----   Message from Burnard Bunting sent at 02/06/2024  7:25 AM EST ----- Please follow-up Franky Macho 4 weeks thanks

## 2024-02-06 NOTE — Telephone Encounter (Signed)
Needs follow up per Dr Diamantina Providence note

## 2024-02-15 ENCOUNTER — Telehealth: Payer: Self-pay | Admitting: Orthopedic Surgery

## 2024-02-15 ENCOUNTER — Other Ambulatory Visit: Payer: Self-pay | Admitting: Surgical

## 2024-02-15 NOTE — Telephone Encounter (Signed)
 Submitted rxrf to pharmacy

## 2024-02-15 NOTE — Telephone Encounter (Signed)
 Patient call requesting refill on nausea medication please.

## 2024-02-27 ENCOUNTER — Ambulatory Visit: Admitting: Family Medicine

## 2024-02-27 ENCOUNTER — Ambulatory Visit

## 2024-02-27 VITALS — BP 130/82 | HR 97 | Ht 66.0 in | Wt 169.4 lb

## 2024-02-27 DIAGNOSIS — M25512 Pain in left shoulder: Secondary | ICD-10-CM | POA: Diagnosis not present

## 2024-02-27 DIAGNOSIS — G8929 Other chronic pain: Secondary | ICD-10-CM

## 2024-02-27 DIAGNOSIS — M542 Cervicalgia: Secondary | ICD-10-CM | POA: Diagnosis not present

## 2024-02-27 NOTE — Progress Notes (Signed)
 I, Rolland Bimler am a scribe for Dr. [name], MD.  Sophia Salazar is a 63 y.o. female who presents to [facility] today for L shoulder and neck pain. Pt was previously seen by Dr. Denyse Amass on 07/30/23 for her R shoulder RC tear, that was surgically repaired by Dr. August Saucer in January.  Today, pt c/o L shoulder and neck pain 5 or 6/10/. Neck is pinching on her nerves with sharp pains where the spine is located. Went to PT today and it was a little but ore challenging than usual. The arm is shaking really badly. R arm is getting better surgery on Jan 08 2024. It is worse than what she thought it was. Would like a refill of the tramadol only.  Radiates:  down to the elbow UE Numbness/tingling: yes UE Weakness: no Aggravates:  Treatments tried: goody powder, tylenol. Oxycodone, tramadol, hydrocodone,hydroxyzine She notes that tramadol is effective.  She brings in pill bottles today of all of her medicines.  She has many oxycodone hydrocodone even tramadol remaining indicating that she is not taking these medicines very much.  Pertinent review of systems: No fevers or chills  Relevant historical information: Right rotator cuff tear   Exam:  BP 130/82   Pulse 97   Ht 5\' 6"  (1.676 m)   Wt 169 lb 6.4 oz (76.8 kg)   SpO2 98%   BMI 27.34 kg/m  General: Well Developed, well nourished, and in no acute distress.   MSK: C-spine: Normal appearing Nontender palpation midline decreased cervical motion. Left shoulder normal-appearing nontender decreased range of motion pain with abduction intact strength positive Hawkins and Neer's test.    Lab and Radiology Results  X-ray images C-spine and left shoulder obtained today personally and independently interpreted.  C-spine: No level DDD.  No acute fractures.  Left shoulder: AC DJD.  No severe glenohumeral DJD.  No acute fractures are visible.  Await formal radiology review  Assessment and Plan: 63 y.o. female  with chronic neck and left shoulder pain.  This occurs in the setting of right shoulder rotator cuff repair recurring on January 08, 2024 by Dr. August Saucer.  She is still attending physical therapy for her right shoulder.  Will add PT for her neck and left shoulder as well.  This is an external physical therapy location. Plan to recheck in 8 weeks.  Return sooner if needed.  Of note I am happy to refill the tramadol when she needs it next.  Based on how any pills she has left is going to be a while and she is not using it very much.   PDMP not reviewed this encounter. Orders Placed This Encounter  Procedures   DG Shoulder Left    Standing Status:   Future    Number of Occurrences:   1    Expiration Date:   02/26/2025    Reason for Exam (SYMPTOM  OR DIAGNOSIS REQUIRED):   eval left shoulder pain    Is patient pregnant?:   No    Preferred imaging location?:   [facility]   DG Cervical Spine 2 or 3 views    Standing Status:   Future    Number of Occurrences:   1    Expiration Date:   02/26/2025    Reason for Exam (SYMPTOM  OR DIAGNOSIS REQUIRED):   eval neck pain    Is patient pregnant?:   No    Preferred imaging location?:   [place]  [facility]   Ambulatory referral to Physical Therapy    Referral Priority:   Routine    Referral Type:   Physical Medicine    Referral Reason:   Specialty Services Required    Requested Specialty:   Physical Therapy    Number of Visits Requested:   1   No orders of the defined types were placed in this encounter.    Discussed warning signs or symptoms. Please see discharge instructions. Patient expresses understanding.   The above documentation has been reviewed and is accurate and complete [name], M.D.

## 2024-02-27 NOTE — Patient Instructions (Addendum)
 Thank you for coming in today.   Please get an Xray today before you leave   I've referred you to Physical Therapy.  Let us know if you don't hear from them in one week.   Recheck in 2 months.   Let me know if this is not working.   I can refill the tramadol when you need me to as you are not using it much.

## 2024-03-10 ENCOUNTER — Telehealth: Payer: Self-pay | Admitting: Orthopedic Surgery

## 2024-03-10 ENCOUNTER — Other Ambulatory Visit: Payer: Self-pay | Admitting: Family

## 2024-03-10 DIAGNOSIS — Z1231 Encounter for screening mammogram for malignant neoplasm of breast: Secondary | ICD-10-CM

## 2024-03-10 NOTE — Telephone Encounter (Signed)
 Pt states her employer is requesting that she returns to work ASAP. Pt states she has not been released to go back to work and that her shoulder/leg still hurts and she's not ready to return to work. She needs a letter for her employer.

## 2024-03-10 NOTE — Telephone Encounter (Signed)
 Big rotator cuff tear.  She would be out at least 3 months after date of surgery which was then mid January.  I am not really going to have her go back to work until at least 3 months and then we would talk about it.

## 2024-03-12 ENCOUNTER — Encounter: Payer: Self-pay | Admitting: Surgical

## 2024-03-12 ENCOUNTER — Ambulatory Visit: Admitting: Orthopedic Surgery

## 2024-03-12 DIAGNOSIS — M5412 Radiculopathy, cervical region: Secondary | ICD-10-CM

## 2024-03-12 NOTE — Progress Notes (Signed)
 Post-Op Visit Note   Patient: Sophia Salazar           Date of Birth: 09/13/1961           MRN: [id number] Visit Date: 03/12/2024 PCP: [facility]   Assessment & Plan:  Chief Complaint:  Chief Complaint  Patient presents with   Right Shoulder - Pain, Follow-up    right shoulder rotator cuff repair with cuff mend augmentation on 01/08/2024.   Visit Diagnoses:  1. Radiculopathy, cervical region     Plan: Patient is a 63 year old female who presents s/p right shoulder rotator cuff tear repair with cuff mend augmentation on 01/08/2024.  She states she has a lot of pain today but this seems to be more related to her neck pain rather than her shoulder.  She feels the shoulder is improving and getting more functional with physical therapy.  She is now able to put her hand to her head.  She does report new sensation of pain along with the new onset neck pain with radiating pain down the right arm down to the wrist as well as the radicular left arm pain that she has seen Dr. Denyse Amass for.  She feels like her neck is "crunchy".  She has had sessions of PT for her neck that actually made her pain worse rather than better.  She has also had prior visit with another provider several years ago where she was encouraged to pursue C5-C6 fusion but ultimately decided against this.  On exam, right shoulder has 80 degrees X rotation, 80 degrees abduction, 140 degrees forward elevation passively.  She has intact supra, infra, subscap strength rated 5/5.  Axillary nerve intact with deltoid firing.  She has incisions that are well-healed from surgery.  Intact EPL, FPL, finger abduction, pronation/supination, bicep, tricep, deltoid.  Negative Hoffmann sign of the right hand.  Right shoulder seems to be improving as expected.  Keep going with physical therapy and see her back in 4 weeks for clinical recheck with Dr. August Saucer.  In the meantime, we will order MRI of the cervical spine for further evaluation  of radiculopathy of both arms and follow-up after MRI to review results at her next appointment.  Physical therapy is making her symptoms worse and she has history of cervical radiculopathy in the past that seem to be enough that surgery was considered that time.  Follow-Up Instructions: No follow-ups on file.   Orders:  Orders Placed This Encounter  Procedures   MR Cervical Spine w/o contrast   No orders of the defined types were placed in this encounter.   Imaging: No results found.  PMFS History: Patient Active Problem List   Diagnosis Date Noted   Nontraumatic complete tear of right rotator cuff 01/24/2024   Synovitis of right shoulder 01/24/2024   Biceps rupture, proximal, right, sequela 01/24/2024   Degenerative superior labral anterior-to-posterior (SLAP) tear of right shoulder 01/24/2024   Past Medical History:  Diagnosis Date   Anxiety    Arthritis    Asthma    History of kidney stones 2024   Hypertension    Stroke (HCC)    1994; no peripheral vision    No family history on file.  Past Surgical History:  Procedure Laterality Date   COSMETIC SURGERY Left    on R arm   SHOULDER ARTHROSCOPY WITH SUBACROMIAL DECOMPRESSION, ROTATOR CUFF REPAIR AND BICEP TENDON REPAIR Right 01/08/2024   Procedure: RIGHT SHOULDER ARTHROSCOPY, DEBRIDEMENT, MINI OPEN BICEP TENODESIS AND ROTATOR CUFF  TEAR REPAIR;  Surgeon: Cammy Copa, MD;  Location: [facility] OR;  Service: Orthopedics;  Laterality: Right;   TUBAL LIGATION     Social History   Occupational History   Not on file  Tobacco Use   Smoking status: Never   Smokeless tobacco: Never  Vaping Use   Vaping status: Never Used  Substance and Sexual Activity   Alcohol use: Yes    Alcohol/week: 1.0 standard drink of alcohol    Types: 1 Glasses of wine per week   Drug use: No   Sexual activity: Not on file

## 2024-03-13 ENCOUNTER — Encounter: Payer: Self-pay | Admitting: Family Medicine

## 2024-03-13 NOTE — Progress Notes (Signed)
 Left shoulder x-ray shows medium arthritis at the small joint at the top of the shoulder the [facility] joint.  Otherwise it looks okay.

## 2024-03-13 NOTE — Progress Notes (Signed)
 Cervical spine x-ray shows arthritis worse at C5-6 and C6-7.

## 2024-03-14 ENCOUNTER — Encounter

## 2024-03-17 ENCOUNTER — Other Ambulatory Visit: Payer: Self-pay

## 2024-03-17 NOTE — Telephone Encounter (Signed)
 Refill request for Tramadol 50 mg to [facility]  Last OV 02/27/24  Not on current med list

## 2024-03-18 MED ORDER — TRAMADOL HCL 50 MG PO TABS: 50.0000 mg | ORAL_TABLET | Freq: Three times a day (TID) | ORAL | 0 refills | Status: AC | PRN

## 2024-03-19 ENCOUNTER — Telehealth: Payer: Self-pay | Admitting: Family Medicine

## 2024-03-19 NOTE — Telephone Encounter (Signed)
 Patient called and wants to ask Dr. Denyse Amass what he wants to do about what her xray shows. She states the orthopaedic doctor that did surgery on her arm did not suggest PT on her neck until seeing what Dr. Denyse Amass wanted to do. She has stopped PT on neck.  Please Advise.

## 2024-03-19 NOTE — Telephone Encounter (Signed)
 Forwarding to Dr. Denyse Amass to review and advise.   CLINICAL DATA:  Neck pain.   EXAM: CERVICAL SPINE - 2-3 VIEW   COMPARISON:  Cervical spine MRI and radiographs from 2022   FINDINGS: Straightening of the normal cervical lordosis and age advanced degenerative cervical spondylosis with degenerative disc disease and facet disease most notably at C5-6 and C6-7. No acute bony findings or abnormal prevertebral soft tissue swelling. The C1-2 articulations are maintained. The lung apices are grossly clear. Remote ununited right clavicle fracture.   IMPRESSION: 1. Age advanced degenerative cervical spondylosis with degenerative disc disease and facet disease most notably at C5-6 and C6-7. 2. No acute bony findings.     Electronically Signed   By: Rudie Meyer M.D.   On: 03/12/2024 14:16

## 2024-03-20 NOTE — Telephone Encounter (Signed)
 Okay to continue physical therapy for the cervical spine.

## 2024-04-01 ENCOUNTER — Inpatient Hospital Stay: Admission: RE | Admit: 2024-04-01 | Source: Ambulatory Visit

## 2024-04-09 ENCOUNTER — Encounter (HOSPITAL_COMMUNITY): Payer: Self-pay | Admitting: Emergency Medicine

## 2024-04-09 ENCOUNTER — Ambulatory Visit: Admitting: Orthopedic Surgery

## 2024-04-09 ENCOUNTER — Other Ambulatory Visit: Payer: Self-pay

## 2024-04-09 ENCOUNTER — Inpatient Hospital Stay (HOSPITAL_COMMUNITY)
Admission: EM | Admit: 2024-04-09 | Discharge: 2024-04-10 | DRG: 916 | Disposition: A | Discharge status: Home / Self Care | Attending: Pulmonary Disease | Admitting: Pulmonary Disease

## 2024-04-09 DIAGNOSIS — Z9851 Tubal ligation status: Secondary | ICD-10-CM

## 2024-04-09 DIAGNOSIS — I10 Essential (primary) hypertension: Secondary | ICD-10-CM | POA: Diagnosis present

## 2024-04-09 DIAGNOSIS — T782XXA Anaphylactic shock, unspecified, initial encounter: Secondary | ICD-10-CM | POA: Diagnosis present

## 2024-04-09 DIAGNOSIS — Z87442 Personal history of urinary calculi: Secondary | ICD-10-CM | POA: Diagnosis not present

## 2024-04-09 DIAGNOSIS — E875 Hyperkalemia: Secondary | ICD-10-CM | POA: Diagnosis present

## 2024-04-09 DIAGNOSIS — J45909 Unspecified asthma, uncomplicated: Secondary | ICD-10-CM | POA: Diagnosis present

## 2024-04-09 DIAGNOSIS — Z79899 Other long term (current) drug therapy: Secondary | ICD-10-CM | POA: Diagnosis not present

## 2024-04-09 DIAGNOSIS — Z7951 Long term (current) use of inhaled steroids: Secondary | ICD-10-CM | POA: Diagnosis not present

## 2024-04-09 DIAGNOSIS — N179 Acute kidney failure, unspecified: Secondary | ICD-10-CM | POA: Diagnosis present

## 2024-04-09 DIAGNOSIS — R471 Dysarthria and anarthria: Secondary | ICD-10-CM | POA: Diagnosis present

## 2024-04-09 DIAGNOSIS — T783XXA Angioneurotic edema, initial encounter: Secondary | ICD-10-CM | POA: Diagnosis present

## 2024-04-09 DIAGNOSIS — Z8673 Personal history of transient ischemic attack (TIA), and cerebral infarction without residual deficits: Secondary | ICD-10-CM

## 2024-04-09 DIAGNOSIS — Z791 Long term (current) use of non-steroidal anti-inflammatories (NSAID): Secondary | ICD-10-CM

## 2024-04-09 LAB — CBC
HCT: 44.5 % (ref 36.0–46.0)
Hemoglobin: 15.4 g/dL — ABNORMAL HIGH (ref 12.0–15.0)
MCH: 32.5 pg (ref 26.0–34.0)
MCHC: 34.6 g/dL (ref 30.0–36.0)
MCV: 93.9 fL (ref 80.0–100.0)
Platelets: 257 10*3/uL (ref 150–400)
RBC: 4.74 MIL/uL (ref 3.87–5.11)
RDW: 12.3 % (ref 11.5–15.5)
WBC: 13.5 10*3/uL — ABNORMAL HIGH (ref 4.0–10.5)
nRBC: 0 % (ref 0.0–0.2)

## 2024-04-09 LAB — COMPREHENSIVE METABOLIC PANEL WITH GFR
ALT: 33 U/L (ref 0–44)
AST: 75 U/L — ABNORMAL HIGH (ref 15–41)
Albumin: 3.9 g/dL (ref 3.5–5.0)
Alkaline Phosphatase: 102 U/L (ref 38–126)
Anion gap: 15 (ref 5–15)
BUN: 13 mg/dL (ref 6–20)
CO2: 20 mmol/L — ABNORMAL LOW (ref 22–32)
Calcium: 9.4 mg/dL (ref 8.9–10.3)
Chloride: 97 mmol/L — ABNORMAL LOW (ref 98–111)
Creatinine, Ser: 1.05 mg/dL — ABNORMAL HIGH (ref 0.44–1.00)
GFR, Estimated: >60 mL/min (ref >60)
Glucose, Bld: 109 mg/dL — ABNORMAL HIGH (ref 70–99)
Potassium: 5.2 mmol/L — ABNORMAL HIGH (ref 3.5–5.1)
Sodium: 132 mmol/L — ABNORMAL LOW (ref 135–145)
Total Bilirubin: 2 mg/dL — ABNORMAL HIGH (ref 0.0–1.2)
Total Protein: 8 g/dL (ref 6.5–8.1)

## 2024-04-09 LAB — TYPE AND SCREEN
ABO/RH(D): O POS
Antibody Screen: NEGATIVE

## 2024-04-09 LAB — GLUCOSE, CAPILLARY
Glucose-Capillary: 232 mg/dL — ABNORMAL HIGH (ref 70–99)
Glucose-Capillary: 260 mg/dL — ABNORMAL HIGH (ref 70–99)
Glucose-Capillary: 212 mg/dL — ABNORMAL HIGH (ref 70–99)
Glucose-Capillary: 121 mg/dL — ABNORMAL HIGH (ref 70–99)

## 2024-04-09 LAB — HEMOGLOBIN A1C
Hgb A1c MFr Bld: 4.4 % — ABNORMAL LOW (ref 4.8–5.6)
Mean Plasma Glucose: 79.58 mg/dL

## 2024-04-09 LAB — MRSA NEXT GEN BY PCR, NASAL: MRSA by PCR Next Gen: NOT DETECTED

## 2024-04-09 LAB — ABO/RH: ABO/RH(D): O POS

## 2024-04-09 MED ORDER — ONDANSETRON HCL 4 MG/2ML IJ SOLN
INTRAMUSCULAR | Status: AC
  Administered 2024-04-09: 4 mg
  Filled 2024-04-09: qty 2

## 2024-04-09 MED ORDER — PANTOPRAZOLE SODIUM 40 MG IV SOLR
40.0000 mg | Freq: Every day | INTRAVENOUS | Status: DC
  Administered 2024-04-09: 40 mg via INTRAVENOUS
  Filled 2024-04-09: qty 10

## 2024-04-09 MED ORDER — DIPHENHYDRAMINE HCL 50 MG/ML IJ SOLN
25.0000 mg | Freq: Once | INTRAMUSCULAR | Status: AC
  Administered 2024-04-09: 25 mg via INTRAVENOUS
  Filled 2024-04-09: qty 1

## 2024-04-09 MED ORDER — METHYLPREDNISOLONE SODIUM SUCC 125 MG IJ SOLR
INTRAMUSCULAR | Status: AC
  Administered 2024-04-09: 125 mg via INTRAVENOUS
  Filled 2024-04-09: qty 2

## 2024-04-09 MED ORDER — LORATADINE 10 MG PO TABS: 5.0000 mg | ORAL_TABLET | Freq: Every evening | ORAL | Status: DC

## 2024-04-09 MED ORDER — FAMOTIDINE IN NACL 20-0.9 MG/50ML-% IV SOLN
INTRAVENOUS | Status: AC
Start: 2024-04-09 — End: 2024-04-09
  Administered 2024-04-09: 20 mg via INTRAVENOUS
  Filled 2024-04-09: qty 50

## 2024-04-09 MED ORDER — MAGNESIUM OXIDE -MG SUPPLEMENT 400 (240 MG) MG PO TABS
400.0000 mg | ORAL_TABLET | Freq: Every day | ORAL | Status: DC
  Filled 2024-04-09: qty 1

## 2024-04-09 MED ORDER — C1 ESTERASE INHIBITOR (HUMAN) 500 UNITS IV KIT
20.0000 [IU]/kg | PACK | Freq: Once | INTRAVENOUS | Status: DC
  Filled 2024-04-09: qty 1500

## 2024-04-09 MED ORDER — EPINEPHRINE HCL 5 MG/250ML IV SOLN IN NS
0.5000 ug/min | INTRAVENOUS | Status: DC
  Administered 2024-04-09: 2 ug/min via INTRAVENOUS

## 2024-04-09 MED ORDER — SODIUM CHLORIDE 0.9 % IV SOLN
INTRAVENOUS | Status: AC
Start: 2024-04-09 — End: 2024-04-10

## 2024-04-09 MED ORDER — MONTELUKAST SODIUM 10 MG PO TABS
10.0000 mg | ORAL_TABLET | Freq: Every day | ORAL | Status: DC
  Administered 2024-04-09 – 2024-04-10 (×2): 10 mg via ORAL
  Filled 2024-04-09 (×2): qty 1

## 2024-04-09 MED ORDER — CHLORHEXIDINE GLUCONATE CLOTH 2 % EX PADS
6.0000 | MEDICATED_PAD | Freq: Every day | CUTANEOUS | Status: DC
  Administered 2024-04-09: 6 via TOPICAL

## 2024-04-09 MED ORDER — EPINEPHRINE 0.3 MG/0.3ML IJ SOAJ
INTRAMUSCULAR | Status: AC
Start: 2024-04-09 — End: 2024-04-09
  Administered 2024-04-09: 0.3 mg via INTRAMUSCULAR
  Filled 2024-04-09: qty 0.3

## 2024-04-09 MED ORDER — INSULIN ASPART 100 UNIT/ML IJ SOLN: 0.0000 [IU] | Freq: Three times a day (TID) | INTRAMUSCULAR | Status: DC

## 2024-04-09 MED ORDER — POLYETHYLENE GLYCOL 3350 17 G PO PACK: 17.0000 g | PACK | Freq: Every day | ORAL | Status: DC | PRN

## 2024-04-09 MED ORDER — METHOCARBAMOL 500 MG PO TABS
500.0000 mg | ORAL_TABLET | Freq: Three times a day (TID) | ORAL | Status: DC | PRN
  Administered 2024-04-09: 500 mg via ORAL
  Filled 2024-04-09: qty 1

## 2024-04-09 MED ORDER — DOCUSATE SODIUM 100 MG PO CAPS: 100.0000 mg | ORAL_CAPSULE | Freq: Two times a day (BID) | ORAL | Status: DC | PRN

## 2024-04-09 MED ORDER — CALCIUM CARBONATE ANTACID 500 MG PO CHEW: 1.0000 | CHEWABLE_TABLET | ORAL | Status: DC | PRN

## 2024-04-09 MED ORDER — MAGNESIUM OXIDE -MG SUPPLEMENT 400 (240 MG) MG PO TABS
400.0000 mg | ORAL_TABLET | Freq: Every day | ORAL | Status: DC
  Administered 2024-04-09 – 2024-04-10 (×2): 400 mg via ORAL
  Filled 2024-04-09 (×2): qty 1

## 2024-04-09 MED ORDER — HEPARIN SODIUM (PORCINE) 5000 UNIT/ML IJ SOLN
5000.0000 [IU] | Freq: Three times a day (TID) | INTRAMUSCULAR | Status: DC
  Administered 2024-04-09: 5000 [IU] via SUBCUTANEOUS
  Filled 2024-04-09 (×2): qty 1

## 2024-04-09 MED ORDER — INSULIN ASPART 100 UNIT/ML IJ SOLN: 0.0000 [IU] | Freq: Every day | INTRAMUSCULAR | Status: DC

## 2024-04-09 MED ORDER — BUDESONIDE 0.5 MG/2ML IN SUSP
0.5000 mg | Freq: Two times a day (BID) | RESPIRATORY_TRACT | Status: DC
  Administered 2024-04-09 – 2024-04-10 (×3): 0.5 mg via RESPIRATORY_TRACT
  Filled 2024-04-09 (×3): qty 2

## 2024-04-09 MED ORDER — ARFORMOTEROL TARTRATE 15 MCG/2ML IN NEBU
15.0000 ug | INHALATION_SOLUTION | Freq: Two times a day (BID) | RESPIRATORY_TRACT | Status: DC
  Administered 2024-04-09 – 2024-04-10 (×3): 15 ug via RESPIRATORY_TRACT
  Filled 2024-04-09 (×3): qty 2

## 2024-04-09 MED ORDER — INSULIN ASPART 100 UNIT/ML IJ SOLN
0.0000 [IU] | Freq: Three times a day (TID) | INTRAMUSCULAR | Status: DC
  Administered 2024-04-09: 8 [IU] via SUBCUTANEOUS
  Administered 2024-04-09: 5 [IU] via SUBCUTANEOUS
  Administered 2024-04-10: 2 [IU] via SUBCUTANEOUS

## 2024-04-09 MED ORDER — LABETALOL HCL 5 MG/ML IV SOLN: 10.0000 mg | INTRAVENOUS | Status: DC | PRN

## 2024-04-09 NOTE — H&P (Signed)
 NAMEShantara Salazar, MRN:  [id number], DOB:  Feb 21, 1961, LOS: 0 ADMISSION DATE:  04/09/2024, CONSULTATION DATE:  4/16 REFERRING MD:  Dr. Pilar Plate EDP, CHIEF COMPLAINT:  tongue swelling   History of Present Illness:  63 year old female with PM as below, which is significant for asthma, HTN, and CVA. She also has a history of mild tongue swelling in the past, which was treated at Urgent Care with steroids. She awoke from sleep at 0300 4/16 with complaints of significant tongue swelling. It got rapidly worse prompting her to present to the ED. She was treated with they typical angioedema/anaphylaxis protocol including epi, benadryl, pepcid, and steroids. She was unable to manage her secretions. PCCM, ENT, and anesthesia were all STAT paged. ENT performed bedside laryngoscopy and found edema was mostly limited to the tongue, but extended to the valleculae. Recommended administering C1 esterase inhibitor. Epi infusion was also started. PCCM was asked to admit to ICU.   Pertinent  Medical History   has a past medical history of Anxiety, Arthritis, Asthma, History of kidney stones (2024), Hypertension, and Stroke (HCC).   Significant Hospital Events: Including procedures, antibiotic start and stop dates in addition to other pertinent events     Interim History / Subjective:    Objective   Blood pressure 135/89, pulse (!) 125, resp. rate 14, height 5\' 6"  (1.676 m), weight 76.7 kg, SpO2 100%.        Intake/Output Summary (Last 24 hours) at 04/09/2024 0516 Last data filed at 04/09/2024 0455 Gross per 24 hour  Intake 50 ml  Output --  Net 50 ml   Filed Weights   04/09/24 0438  Weight: 76.7 kg    Examination: General: adult female in no acute distress HENT: [place]/AT, PERRL, no JVD. No stridor. Submandibular nodes severely enlarged.  Lungs: Clear bilateral breath sounds Cardiovascular: RRR, no MRG Abdomen: Soft, NT, ND Extremities: No acute deformity or ROM limitation.  Neuro: Alert, oriented,  non-focal   Resolved Hospital Problem list     Assessment & Plan:   Angioedema. Does take lisinopril. Has had tongue swelling in the past. Lisinopril was not discontinued at that time. No other allergic symptoms to indicate anaphylaxis. She is having some difficult managing her secretions, she definitely feels like she is getting better.  - epi drip - C1 esterase inhibitor being considered. Will administer should she stop improving.  - discontinue lisinopril - Admit to ICU for close monitoring - Appreciate ENT  Hypertension - holding home amlodipine while NPO - discontinue lisinopril - PRN labetalol if necessary to keep SBP < 170 mmHg  Asthma - Brovana and budesonide in place of home symbicort. Doubt she can use an inhaler right now.  - PRN albuterol    Best Practice (right click and "Reselect all SmartList Selections" daily)   Diet/type: NPO DVT prophylaxis prophylactic heparin  Pressure ulcer(s): pressure ulcer assessment deferred  GI prophylaxis: N/A Lines: N/A Foley:  N/A Code Status:  full code Last date of multidisciplinary goals of care discussion []   Labs   CBC: Recent Labs  Lab 04/09/24 0429  WBC 13.5*  HGB 15.4*  HCT 44.5  MCV 93.9  PLT 257    Basic Metabolic Panel: No results for input(s): "NA", "K", "CL", "CO2", "GLUCOSE", "BUN", "CREATININE", "CALCIUM", "MG", "PHOS" in the last 168 hours. GFR: CrCl cannot be calculated (Patient's most recent lab result is older than the maximum 21 days allowed.). Recent Labs  Lab 04/09/24 0429  WBC 13.5*  Liver Function Tests: No results for input(s): "AST", "ALT", "ALKPHOS", "BILITOT", "PROT", "ALBUMIN" in the last 168 hours. No results for input(s): "LIPASE", "AMYLASE" in the last 168 hours. No results for input(s): "AMMONIA" in the last 168 hours.  ABG No results found for: "PHART", "PCO2ART", "PO2ART", "HCO3", "TCO2", "ACIDBASEDEF", "O2SAT"   Coagulation Profile: No results for input(s): "INR",  "PROTIME" in the last 168 hours.  Cardiac Enzymes: No results for input(s): "CKTOTAL", "CKMB", "CKMBINDEX", "TROPONINI" in the last 168 hours.  HbA1C: No results found for: "HGBA1C"  CBG: No results for input(s): "GLUCAP" in the last 168 hours.  Review of Systems:   Bolds are positive  Constitutional: weight loss, gain, night sweats, Fevers, chills, fatigue .  HEENT: headaches, Sore throat, sneezing, nasal congestion, post nasal drip, Difficulty swallowing, Tooth/dental problems, visual complaints visual changes, ear ache CV:  chest pain, radiates:,Orthopnea, PND, swelling in lower extremities, dizziness, palpitations, syncope.  GI  heartburn, indigestion, abdominal pain, nausea, vomiting, diarrhea, change in bowel habits, loss of appetite, bloody stools.  Resp: cough, productive: , hemoptysis, dyspnea, chest pain, pleuritic.  Skin: rash or itching or icterus GU: dysuria, change in color of urine, urgency or frequency. flank pain, hematuria  MS: joint pain or swelling. decreased range of motion  Psych: change in mood or affect. depression or anxiety.  Neuro: difficulty with speech, weakness, numbness, ataxia    Past Medical History:  She,  has a past medical history of Anxiety, Arthritis, Asthma, History of kidney stones (2024), Hypertension, and Stroke (HCC).   Surgical History:   Past Surgical History:  Procedure Laterality Date   COSMETIC SURGERY Left    on R arm   SHOULDER ARTHROSCOPY WITH SUBACROMIAL DECOMPRESSION, ROTATOR CUFF REPAIR AND BICEP TENDON REPAIR Right 01/08/2024   Procedure: RIGHT SHOULDER ARTHROSCOPY, DEBRIDEMENT, MINI OPEN BICEP TENODESIS AND ROTATOR CUFF TEAR REPAIR;  Surgeon: Cammy Copa, MD;  Location: [facility] OR;  Service: Orthopedics;  Laterality: Right;   TUBAL LIGATION       Social History:   reports that she has never smoked. She has never used smokeless tobacco. She reports current alcohol use of about 1.0 standard drink of alcohol per week. She  reports that she does not use drugs.   Family History:  Her family history is not on file.   Allergies No Known Allergies   Home Medications  Prior to Admission medications   Medication Sig Start Date End Date Taking? Authorizing Provider  albuterol (PROVENTIL HFA;VENTOLIN HFA) 108 (90 BASE) MCG/ACT inhaler Inhale 1 puff into the lungs every 6 (six) hours as needed for wheezing or shortness of breath.    [provider]  amLODipine (NORVASC) 2.5 MG tablet Take 2.5 mg by mouth at bedtime. 04/06/23   [provider]  budesonide-formoterol (SYMBICORT) 160-4.5 MCG/ACT inhaler Inhale 1 puff into the lungs daily.    [provider]  Chlorpheniramine-Phenylephrine 4-10 MG per tablet Take 1 tablet by mouth every 4 (four) hours as needed for congestion.    [provider]  diclofenac Sodium (VOLTAREN) 1 % GEL Apply 1 Application topically 3 (three) times daily as needed (cramps).    [provider]  estradiol (ESTRACE) 0.1 MG/GM vaginal cream Place 1 Applicatorful vaginally daily as needed (atrophy).    [provider]  HYDROcodone-acetaminophen (NORCO/VICODIN) 5-325 MG tablet Take 1 tablet by mouth every 4 (four) hours as needed for moderate pain (pain score 4-6). Do not take Oxycodone with this medication 01/21/24 01/20/25  Magnant, Joycie Peek, PA-C  hydrOXYzine (ATARAX) 25 MG tablet Take 25 mg by mouth at bedtime as needed for anxiety. 12/27/23   [provider]  IODINE, KELP, PO Take 325 mcg by mouth daily.    [provider]  ketotifen (ZADITOR) 0.035 % ophthalmic solution Place 1 drop into both eyes daily.    [provider]  levocetirizine (XYZAL) 5 MG tablet Take 5 mg by mouth every evening.    [provider]  lisinopril (ZESTRIL) 40 MG tablet Take 40 mg by mouth daily. 08/05/18   [provider]  loratadine (CLARITIN) 10 MG tablet Take 10 mg by mouth daily.    [provider]  MAGNESIUM-OXIDE  400 (240 Mg) MG tablet Take 400 mg by mouth daily. 05/03/23   [provider]  meloxicam (MOBIC) 15 MG tablet Take 15 mg by mouth daily. 12/27/23   [provider]  methocarbamol (ROBAXIN) 500 MG tablet Take 1 tablet (500 mg total) by mouth every 8 (eight) hours as needed for muscle spasms. 01/08/24   Magnant, Justice Olp, PA-C  Misc Natural Products (HORNY GOAT WEED) CAPS Take 1 capsule by mouth daily as needed (recreational purposes).    [provider]  Multiple Vitamin (MULTIVITAMIN PO) Take 1 tablet by mouth daily.    [provider]  ondansetron (ZOFRAN) 4 MG tablet TAKE 1 TABLET BY MOUTH EVERY 8 HOURS AS NEEDED FOR NAUSEA OR VOMITING. 02/15/24   Magnant, Charles L, PA-C  OVER THE COUNTER MEDICATION Take 1 tablet by mouth at bedtime as needed (leg cramps). Hyland's Leg Cramp    [provider]  Propylene Glycol (SYSTANE COMPLETE) 0.6 % SOLN Place 1 drop into both eyes daily.    [provider]     Critical care time: 39 minutes

## 2024-04-09 NOTE — Consult Note (Signed)
 Reason for Consult:[name].  HPI: hx of 2.5 hours of swelling of the tongue. She has had this previously but never told to stop ACE inhibitor. She is not having breathing issues. She has tongue swelling with dysarthria.. she feels like the swelling has already gone down since it started hours ago.   Past Medical History:  Diagnosis Date   Anxiety    Arthritis    Asthma    History of kidney stones 2024   Hypertension    Stroke (HCC)    1994; no peripheral vision    Past Surgical History:  Procedure Laterality Date   COSMETIC SURGERY Left    on R arm   SHOULDER ARTHROSCOPY WITH SUBACROMIAL DECOMPRESSION, ROTATOR CUFF REPAIR AND BICEP TENDON REPAIR Right 01/08/2024   Procedure: RIGHT SHOULDER ARTHROSCOPY, DEBRIDEMENT, MINI OPEN BICEP TENODESIS AND ROTATOR CUFF TEAR REPAIR;  Surgeon: Cammy Copa, MD;  Location: [facility] OR;  Service: Orthopedics;  Laterality: Right;   TUBAL LIGATION      History reviewed. No pertinent family history.  Social History:  reports that she has never smoked. She has never used smokeless tobacco. She reports current alcohol use of about 1.0 standard drink of alcohol per week. She reports that she does not use drugs.  Allergies: No Known Allergies  Medications: I have reviewed the patient's current medications.  Results for orders placed or performed during the hospital encounter of 04/09/24 (from the past 48 hours)  CBC     Status: Abnormal   Collection Time: 04/09/24  4:29 AM  Result Value Ref Range   WBC 13.5 (H) 4.0 - 10.5 K/uL   RBC 4.74 3.87 - 5.11 MIL/uL   Hemoglobin 15.4 (H) 12.0 - 15.0 g/dL   HCT 16.1 09.6 - 04.5 %   MCV 93.9 80.0 - 100.0 fL   MCH 32.5 26.0 - 34.0 pg   MCHC 34.6 30.0 - 36.0 g/dL   RDW 40.9 81.1 - 91.4 %   Platelets 257 150 - 400 K/uL   nRBC 0.0 0.0 - 0.2 %    Comment: Performed at [facility], [address], [place], [place] [address]   Comprehensive metabolic panel     Status: Abnormal   Collection Time: 04/09/24  4:29 AM  Result Value Ref Range   Sodium 132 (L) 135 - 145 mmol/L   Potassium 5.2 (H) 3.5 - 5.1 mmol/L    Comment: HEMOLYSIS AT THIS LEVEL MAY AFFECT RESULT   Chloride 97 (L) 98 - 111 mmol/L   CO2 20 (L) 22 - 32 mmol/L   Glucose, Bld 109 (H) 70 - 99 mg/dL    Comment: Glucose reference range applies only to samples taken after fasting for at least 8 hours.   BUN 13 6 - 20 mg/dL   Creatinine, Ser 6.21 (H) 0.44 - 1.00 mg/dL   Calcium 9.4 8.9 - 30.8 mg/dL   Total Protein 8.0 6.5 - 8.1 g/dL   Albumin 3.9 3.5 - 5.0 g/dL   AST 75 (H) 15 - 41 U/L    Comment: HEMOLYSIS AT THIS LEVEL MAY AFFECT RESULT   ALT 33 0 - 44 U/L    Comment: HEMOLYSIS AT THIS LEVEL MAY AFFECT RESULT   Alkaline Phosphatase 102 38 - 126 U/L   Total Bilirubin 2.0 (H) 0.0 - 1.2 mg/dL    Comment: HEMOLYSIS AT THIS LEVEL MAY AFFECT RESULT   GFR, Estimated >60 >60 mL/min  Comment: (NOTE) Calculated using the CKD-EPI Creatinine Equation (2021)    Anion gap 15 5 - 15    Comment: Performed at [facility], [address] [place], [place] [address]  Type and screen [facility]     Status: None (Preliminary result)   Collection Time: 04/09/24  4:40 AM  Result Value Ref Range   ABO/RH(D) PENDING    Antibody Screen PENDING    Sample Expiration      04/12/2024,2359 Performed at [facility], [address] [place], [place] [address]   ABO/Rh     Status: None (Preliminary result)   Collection Time: 04/09/24  4:50 AM  Result Value Ref Range   ABO/RH(D) PENDING     No results found.  ROS Blood pressure 135/89, pulse (!) 125, resp. rate 14, height 5\' 6"  (1.676 m), weight 76.7 kg, SpO2 100%. Physical Exam Constitutional:      Appearance: Normal appearance.  HENT:     Head: Normocephalic.     Right Ear: External ear normal.     Left Ear: External ear normal.     Nose: Nose normal.     Mouth/Throat:      Comments: Tongue is swollen to the top of the mouth. She has a hot potato voice. She has no stridor. She is smiling and joking.  FOE- she was informed of the risks benefits and options of the scope. All questions answered and consent obtained. scope passed through the left nose. The NP is normal. The scope pasted the base of tongue without issue and was open. From the epiglottis going inferiorly there was not have any swelling. Vocal cords move well.  Eyes:     Pupils: Pupils are equal, round, and reactive to light.  Neck:     Comments: Swelling of the submental area.  Neurological:     Mental Status: She is alert.       Assessment/Plan: Angioedema- she has no swelling of the epiglottis or glottis. The swelling stops at the vallecula. She is not in any distress. She feels like she is improving since 3 am start. I would think she can stay in ER trauma bay for another 1-2 hours then admitted to the ICU. She should be past the worsening stage. Call if she is getting any worse.   Sophia Salazar 04/09/2024, 5:24 AM

## 2024-04-09 NOTE — TOC Initial Note (Addendum)
 Transition of Care [facility]) - Initial/Assessment Note    Patient Details  Name: Sophia Salazar MRN: [id number] Date of Birth: [birth year]  [facility]) CM/SW Contact:    Marliss Coots, LCSW Phone Number: 04/09/2024, 12:20 PM  Clinical Narrative:                  12:20 PM CSW introduced self and role to patient at bedside. Patient confirmed she is from home with spouse who could provide transportation and home support upon discharge. Patient stated church friends and son who lives in [place] could also possible provide home support if needed. Patient declined HH/DME/SNF history but confirmed outpatient physical therapy history (began at [facility] of 2024) who she is currently active with. No TOC needs were identified at this time.  Expected Discharge Plan: Home/Self Care Barriers to Discharge: Continued Medical Work up   Patient Goals and CMS Choice Patient states their goals for this hospitalization and ongoing recovery are:: to return home          Expected Discharge Plan and Services       Living arrangements for the past 2 months: Single Family Home                                      Prior Living Arrangements/Services Living arrangements for the past 2 months: Single Family Home Lives with:: Spouse Patient language and need for interpreter reviewed:: Yes Do you feel safe going back to the place where you live?: Yes            Criminal Activity/Legal Involvement Pertinent to Current Situation/Hospitalization: No - Comment as needed  Activities of Daily Living   ADL Screening (condition at time of admission) Independently performs ADLs?: Yes (appropriate for developmental age) Is the patient deaf or have difficulty hearing?: No Does the patient have difficulty seeing, even when wearing glasses/contacts?: No Does the patient have difficulty concentrating, remembering, or making decisions?:  No  Permission Sought/Granted Permission sought to share information with : Family Supports Permission granted to share information with : No (Contact information on chart)  Share Information with NAME: Darrin Horn     Permission granted to share info w Relationship: Spouse  Permission granted to share info w Contact Information: [phone]  Emotional Assessment Appearance:: Appears stated age Attitude/Demeanor/Rapport: Engaged Affect (typically observed): Accepting, Appropriate, Adaptable, Calm, Stable, Pleasant Orientation: : Oriented to Self, Oriented to Place, Oriented to  Time, Oriented to Situation Alcohol / Substance Use: Not Applicable Psych Involvement: No (comment)  Admission diagnosis:  Angioedema [T78.3XXA] Patient Active Problem List   Diagnosis Date Noted   Angioedema 04/09/2024   Nontraumatic complete tear of right rotator cuff 01/24/2024   Synovitis of right shoulder 01/24/2024   Biceps rupture, proximal, right, sequela 01/24/2024   Degenerative superior labral anterior-to-posterior (SLAP) tear of right shoulder 01/24/2024   PCP:  [facility] Pharmacy:   [facility] - [place], [place] - [address] [place] [address] Phone: [phone] Fax: [phone]     Social Drivers of Health (SDOH) Social History: SDOH Screenings   Food Insecurity: No Food Insecurity (04/09/2024)  Housing: Low Risk  (04/09/2024)  Transportation Needs: No Transportation Needs (04/09/2024)  Utilities: Not At Risk (04/09/2024)  Social Connections: Unknown (04/09/2024)  Tobacco Use: Low Risk  (04/09/2024)   SDOH Interventions:  Readmission Risk Interventions     No data to display

## 2024-04-09 NOTE — ED Triage Notes (Signed)
 Patient coming to ED for evaluation of tongue swelling.  Reports swelling woke her from sleep.  Took "3 Benadryl" prior to coming to ED.  Patient tongue is swollen.  Unable to manage secretions or swallow.  Pt reports no allergies to medications.  Does take Lisinopril for HTN.  No hx of angioedema

## 2024-04-09 NOTE — ED Notes (Signed)
 Pt is able to talk and is swallowing secretions.

## 2024-04-09 NOTE — ED Provider Notes (Signed)
 [facility] Emergency Department Provider Note MRN:  [id number]  Arrival date & time: 04/09/24     Chief Complaint   Oral Swelling and Allergic Reaction   History of Present Illness   Sophia Salazar is a 63 y.o. year-old female with a history of hypertension, stroke presenting to the ED with chief complaint of oral swelling.  Woke up this evening with very large swollen tongue, was getting rapidly worse, here for evaluation.  Review of Systems  A thorough review of systems was obtained and all systems are negative except as noted in the HPI and PMH.   Patient's Health History    Past Medical History:  Diagnosis Date   Anxiety    Arthritis    Asthma    History of kidney stones 2024   Hypertension    Stroke (HCC)    1994; no peripheral vision    Past Surgical History:  Procedure Laterality Date   COSMETIC SURGERY Left    on R arm   SHOULDER ARTHROSCOPY WITH SUBACROMIAL DECOMPRESSION, ROTATOR CUFF REPAIR AND BICEP TENDON REPAIR Right 01/08/2024   Procedure: RIGHT SHOULDER ARTHROSCOPY, DEBRIDEMENT, MINI OPEN BICEP TENODESIS AND ROTATOR CUFF TEAR REPAIR;  Surgeon: Cammy Copa, MD;  Location: [facility] OR;  Service: Orthopedics;  Laterality: Right;   TUBAL LIGATION      History reviewed. No pertinent family history.  Social History   Socioeconomic History   Marital status: Married    Spouse name: Not on file   Number of children: Not on file   Years of education: Not on file   Highest education level: Not on file  Occupational History   Not on file  Tobacco Use   Smoking status: Never   Smokeless tobacco: Never  Vaping Use   Vaping status: Never Used  Substance and Sexual Activity   Alcohol use: Yes    Alcohol/week: 1.0 standard drink of alcohol    Types: 1 Glasses of wine per week   Drug use: No   Sexual activity: Not on file  Other Topics Concern   Not on file  Social History Narrative   Not on file   Social Drivers of Health    Financial Resource Strain: Not on file  Food Insecurity: Not on file  Transportation Needs: Not on file  Physical Activity: Not on file  Stress: Not on file  Social Connections: Not on file  Intimate Partner Violence: Not on file     Physical Exam   Vitals:   04/09/24 0730 04/09/24 0820  BP: 103/62   Pulse: (!) 120   Resp: (!) 21   Temp:  98 F (36.7 C)  SpO2: 100%     CONSTITUTIONAL: Well-appearing, NAD NEURO/PSYCH:  Alert and oriented x 3, no focal deficits EYES:  eyes equal and reactive ENT/NECK: Swollen submandibular regions bilaterally, prominently swollen tongue occluding any view of the oropharynx, patient's voice is muffled CARDIO: Regular rate, well-perfused, normal S1 and S2 PULM:  CTAB no wheezing or rhonchi GI/GU:  non-distended, non-tender MSK/SPINE:  No gross deformities, no edema SKIN:  no rash, atraumatic   *Additional and/or pertinent findings included in MDM below  Diagnostic and Interventional Summary    EKG Interpretation Date/Time:    Ventricular Rate:    PR Interval:    QRS Duration:    QT Interval:    QTC Calculation:   R Axis:      Text Interpretation:         Labs Reviewed  CBC -  Abnormal; Notable for the following components:      Result Value   WBC 13.5 (*)    Hemoglobin 15.4 (*)    All other components within normal limits  COMPREHENSIVE METABOLIC PANEL WITH GFR - Abnormal; Notable for the following components:   Sodium 132 (*)    Potassium 5.2 (*)    Chloride 97 (*)    CO2 20 (*)    Glucose, Bld 109 (*)    Creatinine, Ser 1.05 (*)    AST 75 (*)    Total Bilirubin 2.0 (*)    All other components within normal limits  GLUCOSE, CAPILLARY - Abnormal; Notable for the following components:   Glucose-Capillary 232 (*)    All other components within normal limits  TYPE AND SCREEN  ABO/RH    No orders to display    Medications  EPINEPHrine (ADRENALIN) 5 mg in NS 250 mL (0.02 mg/mL) premix infusion (2 mcg/min Intravenous  New Bag/Given 04/09/24 0500)  0.9 %  sodium chloride infusion ( Intravenous New Bag/Given 04/09/24 0512)  C1 esterase inhibitor (Human) (BERINERT) injection 1,500 Units (0 Units Intravenous Hold 04/09/24 0538)  docusate sodium (COLACE) capsule 100 mg (has no administration in time range)  polyethylene glycol (MIRALAX / GLYCOLAX) packet 17 g (has no administration in time range)  heparin injection 5,000 Units (has no administration in time range)  labetalol (NORMODYNE) injection 10 mg (has no administration in time range)  arformoterol (BROVANA) nebulizer solution 15 mcg (15 mcg Nebulization Given 04/09/24 0753)  budesonide (PULMICORT) nebulizer solution 0.5 mg (0.5 mg Nebulization Given 04/09/24 0753)  methylPREDNISolone sodium succinate (SOLU-MEDROL) 125 mg/2 mL injection (125 mg Intravenous Given 04/09/24 0435)  famotidine (PEPCID) 20-0.9 MG/50ML-% IVPB (0 mg  Stopped 04/09/24 0455)  EPINEPHrine (EPI-PEN) 0.3 mg/0.3 mL injection (0.3 mg Intramuscular Given 04/09/24 0440)  ondansetron (ZOFRAN) 4 MG/2ML injection (4 mg  Given 04/09/24 0453)  diphenhydrAMINE (BENADRYL) injection 25 mg (25 mg Intravenous Given 04/09/24 0512)     Procedures  /  Critical Care .Critical Care  Performed by: Edson Graces, MD Authorized by: Edson Graces, MD   Critical care provider statement:    Critical care time (minutes):  75   Critical care was necessary to treat or prevent imminent or life-threatening deterioration of the following conditions: Angioedema.   Critical care was time spent personally by me on the following activities:  Development of treatment plan with patient or surrogate, discussions with consultants, evaluation of patient's response to treatment, examination of patient, ordering and review of laboratory studies, ordering and review of radiographic studies, ordering and performing treatments and interventions, pulse oximetry, re-evaluation of patient's condition and review of old charts   ED  Course and Medical Decision Making  Initial Impression and Ddx Highly concerning presentation for angioedema, patient takes lisinopril and so this may be the culprit.  Still considering allergic type of angioedema and so provided with EpiPen, Benadryl, Pepcid, Solu-Medrol.  Stat page to ICU, ENT, anesthesia for assistance given that the swelling is so prominent, patient is unable to tolerate secretions, may need emergent airway and I see no way that blade will pass through her oropharynx and so awake nasal intubation would probably be the best approach.  With LOC patient seems to be stabilizing, not getting worse, possibly slightly improving with regard to her speech, vitals normal, no stridor, still drooling.  ICU accepts patient for admission, anesthesia on standby, Dr. Virgia Griffins of ENT on the way to see the patient.  Providing epi  drip to ensure no rebound symptoms, also providing C1 esterase inhibitor per ENT recommendations.  Past medical/surgical history that increases complexity of ED encounter: Hypertension takes lisinopril  Interpretation of Diagnostics I personally reviewed the Laboratory Testing and my interpretation is as follows: No significant blood count or electrolyte disturbance.    Patient Reassessment and Ultimate Disposition/Management     Patient evaluated by ICU, ENT, anesthesia.  Thankful for the acute assessment of our consultants.  Patient is slowly improving after epinephrine, which is still very severe Solu-Medrol.  I do like nasal intubation in the operating room considered however felt best to monitor closely and manage medically given improvement.  Admitted to the ICU.  Patient management required discussion with the following services or consulting groups:  Intensivist Service and ENT/Plastic Surgery  Complexity of Problems Addressed Acute illness or injury that poses threat of life of bodily function  Additional Data Reviewed and Analyzed Further history obtained  from: Further history from spouse/family member  Additional Factors Impacting ED Encounter Risk Consideration of hospitalization  Merrick Abe. Harless Lien, MD [facility] Emergency Medicine [facility] [email or web address]  Final Clinical Impressions(s) / ED Diagnoses     ICD-10-CM   1. Angioedema, initial encounter  T78.3XXA       ED Discharge Orders     None        Discharge Instructions Discussed with and Provided to Patient:   Discharge Instructions   None      Edson Graces, MD 04/09/24 [phone]

## 2024-04-09 NOTE — Plan of Care (Signed)

## 2024-04-10 DIAGNOSIS — N179 Acute kidney failure, unspecified: Secondary | ICD-10-CM | POA: Diagnosis not present

## 2024-04-10 DIAGNOSIS — T783XXA Angioneurotic edema, initial encounter: Secondary | ICD-10-CM | POA: Diagnosis not present

## 2024-04-10 DIAGNOSIS — T782XXA Anaphylactic shock, unspecified, initial encounter: Secondary | ICD-10-CM | POA: Diagnosis present

## 2024-04-10 DIAGNOSIS — J45909 Unspecified asthma, uncomplicated: Secondary | ICD-10-CM

## 2024-04-10 DIAGNOSIS — I10 Essential (primary) hypertension: Secondary | ICD-10-CM | POA: Diagnosis not present

## 2024-04-10 LAB — BASIC METABOLIC PANEL WITH GFR
Anion gap: 8 (ref 5–15)
Anion gap: 9 (ref 5–15)
BUN: 30 mg/dL — ABNORMAL HIGH (ref 6–20)
BUN: 32 mg/dL — ABNORMAL HIGH (ref 6–20)
CO2: 24 mmol/L (ref 22–32)
CO2: 23 mmol/L (ref 22–32)
Calcium: 9.4 mg/dL (ref 8.9–10.3)
Calcium: 9 mg/dL (ref 8.9–10.3)
Chloride: 104 mmol/L (ref 98–111)
Chloride: 103 mmol/L (ref 98–111)
Creatinine, Ser: 1.37 mg/dL — ABNORMAL HIGH (ref 0.44–1.00)
Creatinine, Ser: 1.3 mg/dL — ABNORMAL HIGH (ref 0.44–1.00)
GFR, Estimated: 44 mL/min — ABNORMAL LOW (ref >60)
GFR, Estimated: 47 mL/min — ABNORMAL LOW (ref >60)
Glucose, Bld: 104 mg/dL — ABNORMAL HIGH (ref 70–99)
Glucose, Bld: 96 mg/dL (ref 70–99)
Potassium: 5.4 mmol/L — ABNORMAL HIGH (ref 3.5–5.1)
Potassium: 4.9 mmol/L (ref 3.5–5.1)
Sodium: 136 mmol/L (ref 135–145)
Sodium: 135 mmol/L (ref 135–145)

## 2024-04-10 LAB — CBC
HCT: 37.7 % (ref 36.0–46.0)
Hemoglobin: 13.3 g/dL (ref 12.0–15.0)
MCH: 32.8 pg (ref 26.0–34.0)
MCHC: 35.3 g/dL (ref 30.0–36.0)
MCV: 93.1 fL (ref 80.0–100.0)
Platelets: 196 10*3/uL (ref 150–400)
RBC: 4.05 MIL/uL (ref 3.87–5.11)
RDW: 12.6 % (ref 11.5–15.5)
WBC: 12.1 10*3/uL — ABNORMAL HIGH (ref 4.0–10.5)
nRBC: 0 % (ref 0.0–0.2)

## 2024-04-10 LAB — GLUCOSE, CAPILLARY
Glucose-Capillary: 143 mg/dL — ABNORMAL HIGH (ref 70–99)
Glucose-Capillary: 108 mg/dL — ABNORMAL HIGH (ref 70–99)

## 2024-04-10 LAB — MAGNESIUM: Magnesium: 1.9 mg/dL (ref 1.7–2.4)

## 2024-04-10 LAB — HIV ANTIBODY (ROUTINE TESTING W REFLEX): HIV Screen 4th Generation wRfx: NONREACTIVE

## 2024-04-10 LAB — PHOSPHORUS: Phosphorus: 4.1 mg/dL (ref 2.5–4.6)

## 2024-04-10 MED ORDER — SODIUM CHLORIDE 0.9 % IV BOLUS
1000.0000 mL | Freq: Once | INTRAVENOUS | Status: AC
Start: 2024-04-10 — End: 2024-04-10
  Administered 2024-04-10: 1000 mL via INTRAVENOUS

## 2024-04-10 NOTE — Discharge Summary (Addendum)
 Resident Discharge Summary  Patient ID: Sophia Salazar MRN: [id number] DOB/AGE: [birth year] 61 y.o.  Admit date: 61/16/2025 Discharge date: 04/10/2024  Problem List Principal Problem:   Angioedema  HPI: 63 year old female with PM  significant for asthma, HTN, and CVA. She also has a history of mild tongue swelling in the past, which was treated at Urgent Care with steroids. She awoke from sleep at 0300 4/16 with complaints of significant tongue swelling. It got rapidly worse prompting her to present to the ED. She was treated with they typical angioedema/anaphylaxis protocol including epi, benadryl, pepcid, and steroids. She was unable to manage her secretions. PCCM, ENT, and anesthesia were all STAT paged. ENT performed bedside laryngoscopy and found edema was mostly limited to the tongue, but extended to the valleculae. Recommended administering C1 esterase inhibitor. Epi infusion was also started. PCCM was asked to admit to ICU.   Hospital Course: On arrival to the ICU, patient was monitored for airway patency. Her cardiac function was monitored a well. She remained stable through out her time in the ICU.Her Lisinopril was discontinued indefinitely. She had an acute kidney injury and hyperkalemia on presentation. She was given IVF and her repeat BMP was showed resolved hyperkalemia and improved Scr. Patient is recommended to follow up with her PCP within a week to recheck her Kidney function.     Labs at discharge Lab Results  Component Value Date   CREATININE 1.30 (H) 04/10/2024   BUN 32 (H) 04/10/2024   NA 135 04/10/2024   K 4.9 04/10/2024   CL 103 04/10/2024   CO2 23 04/10/2024   Lab Results  Component Value Date   WBC 12.1 (H) 04/10/2024   HGB 13.3 04/10/2024   HCT 37.7 04/10/2024   MCV 93.1 04/10/2024   PLT 196 04/10/2024   Lab Results  Component Value Date   ALT 33 04/09/2024   AST 75 (H) 04/09/2024   ALKPHOS 102 04/09/2024   BILITOT 2.0 (H) 04/09/2024   No results found  for: "INR", "PROTIME"  Current radiology studies No results found.  Disposition:Patient safe to go home with the following instructions"  Dear Sophia Salazar ,  It was a pleasure taking care of you at [facility] You were admitted for tongue swelling  and treated for angioedema. We are discharging you home now that you are doing better. Please follow the following instructions. Please STOP taking lisinopril 40mg  tablets. This family/class of medications, called ACE-inhibitors, has been added to your list of allergies with the reaction of "swelling."  Please follow up with your primary care physician for a repeat kidney function lab.  Take care,  Dr. Kathleen Lime, MD        Allergies as of 04/10/2024       Reactions   Ace Inhibitors Swelling   Admitted 04/09/24 with ACE-I angioedema        Medication List     STOP taking these medications    estradiol 0.1 MG/GM vaginal cream Commonly known as: ESTRACE   HYDROcodone-acetaminophen 5-325 MG tablet Commonly known as: NORCO/VICODIN   lisinopril 40 MG tablet Commonly known as: ZESTRIL       TAKE these medications    albuterol 108 (90 Base) MCG/ACT inhaler Commonly known as: VENTOLIN HFA Inhale 1 puff into the lungs every 6 (six) hours as needed for wheezing or shortness of breath.   amLODipine 2.5 MG tablet Commonly known as: NORVASC Take 2.5 mg by mouth at bedtime.   budesonide-formoterol 160-4.5 MCG/ACT  inhaler Commonly known as: SYMBICORT Inhale 1 puff into the lungs daily.   diclofenac Sodium 1 % Gel Commonly known as: VOLTAREN Apply 1 Application topically 3 (three) times daily as needed (pain).   diphenhydrAMINE 25 MG tablet Commonly known as: BENADRYL Take 75 mg by mouth once.   hydrOXYzine 25 MG tablet Commonly known as: ATARAX Take 25 mg by mouth at bedtime as needed for anxiety.   IODINE (KELP) PO Take 1 tablet by mouth daily.   ipratropium-albuterol 0.5-2.5 (3) MG/3ML Soln Commonly  known as: DUONEB Take 3 mLs by nebulization every 6 (six) hours as needed (wheezing).   ketotifen 0.035 % ophthalmic solution Commonly known as: ZADITOR Place 1 drop into both eyes daily.   levocetirizine 5 MG tablet Commonly known as: XYZAL Take 5 mg by mouth every evening.   MAGnesium-Oxide 400 (240 Mg) MG tablet Generic drug: magnesium oxide Take 400 mg by mouth daily.   meloxicam 15 MG tablet Commonly known as: MOBIC Take 15 mg by mouth daily.   methocarbamol 500 MG tablet Commonly known as: ROBAXIN Take 1 tablet (500 mg total) by mouth every 8 (eight) hours as needed for muscle spasms. What changed: when to take this   montelukast 10 MG tablet Commonly known as: SINGULAIR Take 10 mg by mouth daily.   MULTIVITAMIN PO Take 1 tablet by mouth daily.   ondansetron 4 MG tablet Commonly known as: ZOFRAN TAKE 1 TABLET BY MOUTH EVERY 8 HOURS AS NEEDED FOR NAUSEA OR VOMITING. What changed:  when to take this reasons to take this   OVER THE COUNTER MEDICATION Take 1 tablet by mouth at bedtime as needed (leg cramps). Hyland's Leg Cramp   Systane Complete 0.6 % Soln Generic drug: Propylene Glycol Place 1 drop into both eyes daily.   traMADol 50 MG tablet Commonly known as: ULTRAM Take 50 mg by mouth daily as needed for moderate pain (pain score 4-6) or severe pain (pain score 7-10).        Follow-up Information     [facility] Follow up.   Why: Please call to make an appointment within a  week Contact information: [address] [place] [address] [phone]                  Discharged Condition: good  Time spent on discharge greater than 40 minutes.  Vital signs at Discharge. Temp:  [97.1 F (36.2 C)-98.7 F (37.1 C)] 98.6 F (37 C) (04/17 1150) Pulse Rate:  [67-121] 85 (04/17 1200) Resp:  [12-30] 23 (04/17 1200) BP: (89-177)/(45-151) 118/81 (04/17 1100) SpO2:  [88 %-100 %] 93 % (04/17 1200) Weight:  [76.2 kg] 76.2 kg  (04/17 0500) Office follow up Special Information or instructions. Signed:  Fay Hoop, MD Internal Medicine, PGY-1 61/17/2025, 1:35 PM [phone]  If no response to pager, please call [phone] until 1900 After 1900 please call [facility] [phone]

## 2024-04-10 NOTE — Discharge Instructions (Addendum)
 Dear Ms Rape ,  It was a pleasure taking care of you at [facility]. You were admitted for tongue swelling  and treated for angioedema. We are discharging you home now that you are doing better. Please follow the following instructions. Please STOP taking lisinopril 40mg  tablets. This family/class of medications, called ACE-inhibitors, has been added to your list of allergies with the reaction of "swelling."  Please follow up with your primary care physician for a repeat kidney function lab.  Take care,  Dr. Fay Hoop, MD

## 2024-04-10 NOTE — Progress Notes (Signed)
 NAMEJakyia Salazar, MRN:  [id number], DOB:  12-29-1960, LOS: 1 ADMISSION DATE:  04/09/2024, CONSULTATION DATE:  04/09/2024 REFERRING MD:  EDP CHIEF COMPLAINT: Tongue swelling  History of Present Illness:  This is a 63 year old female with a history of asthma, hypertension on lisinopril, CVA who presented with tongue swelling.  She reports she has had this issue in the past which resolved with steroids and antihistamines and received C1 esterase inhibitor.  Pertinent  Medical History   Past Medical History:  Diagnosis Date   Anxiety    Arthritis    Asthma    History of kidney stones 2024   Hypertension    Stroke (HCC)    1994; no peripheral vision     Significant Hospital Events: Including procedures, antibiotic start and stop dates in addition to other pertinent events     Interim History / Subjective:  4/16 Admitted  Objective   Blood pressure (!) 96/45, pulse 67, temperature 98.2 F (36.8 C), temperature source Oral, resp. rate 14, height 5\' 6"  (1.676 m), weight 76.2 kg, SpO2 96%.        Intake/Output Summary (Last 24 hours) at 04/10/2024 0710 Last data filed at 04/09/2024 2000 Gross per 24 hour  Intake 471.36 ml  Output --  Net 471.36 ml   Filed Weights   04/09/24 0438 04/09/24 0900 04/10/24 0500  Weight: 76.7 kg 75 kg 76.2 kg    Examination: General: NAD, Sitting in chair on her phone  HENT: Tongue swelling has improved significantly Lungs: Clear to auscultation Cardiovascular: Normal heart sounds, regular rate and rhythm, no abnormal sounds appreciated Abdomen: Soft, nontender Extremities: No lower extremity edema noted, warm and dry Neuro: Oriented to time place and person.  Resolved Hospital Problem list   Angioedema  Assessment & Plan:  #Angioedema - Tongue swelling has improved markedly - Discontinue lisinopril switch to another class of antihypertensive - On room air, no concerns for respiratory distress - I think this patient is stable today  include possibly discharge home   #Asthma - On home Brovana and budesonide - Resume home meds when appropriate - Albuterol as needed   #Hypertension - Blood pressure has been stabilizing this hospitalization - Discontinue lisinopril as above - Continue to monitor   Best Practice (right click and "Reselect all SmartList Selections" daily)   Diet/type: Regular consistency (see orders) DVT prophylaxis: prophylactic heparin  GI prophylaxis: N/A Lines: N/A Foley:  N/A Code Status:  full code Last date of multidisciplinary goals of care discussion [04/10/2024]  Labs   CBC: Recent Labs  Lab 04/09/24 0429  WBC 13.5*  HGB 15.4*  HCT 44.5  MCV 93.9  PLT 257    Basic Metabolic Panel: Recent Labs  Lab 04/09/24 0429  NA 132*  K 5.2*  CL 97*  CO2 20*  GLUCOSE 109*  BUN 13  CREATININE 1.05*  CALCIUM 9.4   GFR: Estimated Creatinine Clearance: 59.5 mL/min (A) (by C-G formula based on SCr of 1.05 mg/dL (H)). Recent Labs  Lab 04/09/24 0429  WBC 13.5*    Liver Function Tests: Recent Labs  Lab 04/09/24 0429  AST 75*  ALT 33  ALKPHOS 102  BILITOT 2.0*  PROT 8.0  ALBUMIN 3.9   No results for input(s): "LIPASE", "AMYLASE" in the last 168 hours. No results for input(s): "AMMONIA" in the last 168 hours.  ABG No results found for: "PHART", "PCO2ART", "PO2ART", "HCO3", "TCO2", "ACIDBASEDEF", "O2SAT"   Coagulation Profile: No results for input(s): "INR", "PROTIME" in the last  168 hours.  Cardiac Enzymes: No results for input(s): "CKTOTAL", "CKMB", "CKMBINDEX", "TROPONINI" in the last 168 hours.  HbA1C: Hgb A1c MFr Bld  Date/Time Value Ref Range Status  04/09/2024 08:37 PM 4.4 (L) 4.8 - 5.6 % Final    Comment:    (NOTE) Pre diabetes:          5.7%-6.4%  Diabetes:              >6.4%  Glycemic control for   <7.0% adults with diabetes     CBG: Recent Labs  Lab 04/09/24 0821 04/09/24 1120 04/09/24 1541 04/09/24 2131  GLUCAP 232* 260* 212* 121*     Review of Systems:   Denies pain, shortness of breath, chest pain.  Past Medical History:  She,  has a past medical history of Anxiety, Arthritis, Asthma, History of kidney stones (2024), Hypertension, and Stroke (HCC).   Surgical History:   Past Surgical History:  Procedure Laterality Date   COSMETIC SURGERY Left    on R arm   SHOULDER ARTHROSCOPY WITH SUBACROMIAL DECOMPRESSION, ROTATOR CUFF REPAIR AND BICEP TENDON REPAIR Right 01/08/2024   Procedure: RIGHT SHOULDER ARTHROSCOPY, DEBRIDEMENT, MINI OPEN BICEP TENODESIS AND ROTATOR CUFF TEAR REPAIR;  Surgeon: Jasmine Mesi, MD;  Location: [facility] OR;  Service: Orthopedics;  Laterality: Right;   TUBAL LIGATION       Social History:   reports that she has never smoked. She has never used smokeless tobacco. She reports current alcohol use of about 1.0 standard drink of alcohol per week. She reports that she does not use drugs.   Family History:  Her family history is not on file.   Allergies Allergies  Allergen Reactions   Ace Inhibitors Swelling    Admitted 04/09/24 with ACE-I angioedema     Home Medications  Prior to Admission medications   Medication Sig Start Date End Date Taking? Authorizing Provider  albuterol (PROVENTIL HFA;VENTOLIN HFA) 108 (90 BASE) MCG/ACT inhaler Inhale 1 puff into the lungs every 6 (six) hours as needed for wheezing or shortness of breath.   Yes [provider]  amLODipine (NORVASC) 2.5 MG tablet Take 2.5 mg by mouth at bedtime. 04/06/23  Yes [provider]  budesonide-formoterol (SYMBICORT) 160-4.5 MCG/ACT inhaler Inhale 1 puff into the lungs daily.   Yes [provider]  diclofenac Sodium (VOLTAREN) 1 % GEL Apply 1 Application topically 3 (three) times daily as needed (pain).   Yes [provider]  diphenhydrAMINE (BENADRYL) 25 MG tablet Take 75 mg by mouth once.   Yes [provider]  HYDROcodone-acetaminophen (NORCO/VICODIN) 5-325 MG tablet Take 1  tablet by mouth every 4 (four) hours as needed for moderate pain (pain score 4-6). Do not take Oxycodone with this medication Patient taking differently: Take 1 tablet by mouth daily as needed for moderate pain (pain score 4-6). 01/21/24 01/20/25 Yes Magnant, Charles L, PA-C  hydrOXYzine (ATARAX) 25 MG tablet Take 25 mg by mouth at bedtime as needed for anxiety. 12/27/23  Yes [provider]  IODINE, KELP, PO Take 1 tablet by mouth daily.   Yes [provider]  ipratropium-albuterol (DUONEB) 0.5-2.5 (3) MG/3ML SOLN Take 3 mLs by nebulization every 6 (six) hours as needed (wheezing). 03/27/24  Yes [provider]  ketotifen (ZADITOR) 0.035 % ophthalmic solution Place 1 drop into both eyes daily.   Yes [provider]  levocetirizine (XYZAL) 5 MG tablet Take 5 mg by mouth every evening.   Yes [provider]  lisinopril (ZESTRIL)  40 MG tablet Take 40 mg by mouth daily. 08/05/18  Yes [provider]  MAGNESIUM-OXIDE 400 (240 Mg) MG tablet Take 400 mg by mouth daily. 05/03/23  Yes [provider]  meloxicam (MOBIC) 15 MG tablet Take 15 mg by mouth daily. 12/27/23  Yes [provider]  methocarbamol (ROBAXIN) 500 MG tablet Take 1 tablet (500 mg total) by mouth every 8 (eight) hours as needed for muscle spasms. Patient taking differently: Take 500 mg by mouth daily as needed for muscle spasms. 01/08/24  Yes Magnant, Charles L, PA-C  montelukast (SINGULAIR) 10 MG tablet Take 10 mg by mouth daily. 03/27/24  Yes [provider]  Multiple Vitamin (MULTIVITAMIN PO) Take 1 tablet by mouth daily.   Yes [provider]  ondansetron (ZOFRAN) 4 MG tablet TAKE 1 TABLET BY MOUTH EVERY 8 HOURS AS NEEDED FOR NAUSEA OR VOMITING. Patient taking differently: Take 4 mg by mouth as needed for nausea or vomiting. 02/15/24  Yes Magnant, Charles L, PA-C  OVER THE COUNTER MEDICATION Take 1 tablet by mouth at bedtime as needed (leg cramps). Hyland's Leg  Cramp   Yes [provider]  Propylene Glycol (SYSTANE COMPLETE) 0.6 % SOLN Place 1 drop into both eyes daily.   Yes [provider]  traMADol (ULTRAM) 50 MG tablet Take 50 mg by mouth daily as needed for moderate pain (pain score 4-6) or severe pain (pain score 7-10).   Yes [provider]  estradiol (ESTRACE) 0.1 MG/GM vaginal cream Place 1 Applicatorful vaginally daily as needed (atrophy). Patient not taking: Reported on 04/09/2024    [provider]     Critical care time: 40 minutes       Fay Hoop MD [facility] Internal Medicine Program - PGY-1 04/10/2024, 7:10 AM Pager# [phone]

## 2024-04-10 NOTE — TOC Transition Note (Signed)
 Transition of Care [facility]) - Discharge Note   Patient Details  Name: Luv Mish MRN: [id number] Date of Birth: Dec 13, 1961  [facility] CM/SW Contact:  Jeani Mill, RN Phone Number: 04/10/2024, 2:01 PM   Clinical Narrative:    Patient stable to discharge home.  No TOC needs at this time.    Final next level of care: Home/Self Care Barriers to Discharge: Barriers Resolved   Patient Goals and CMS Choice Patient states their goals for this hospitalization and ongoing recovery are:: to return home          Discharge Placement                 Home      Discharge Plan and Services Additional resources added to the After Visit Summary for                                       Social Drivers of Health (SDOH) Interventions SDOH Screenings   Food Insecurity: No Food Insecurity (04/09/2024)  Housing: Low Risk  (04/09/2024)  Transportation Needs: No Transportation Needs (04/09/2024)  Utilities: Not At Risk (04/09/2024)  Social Connections: Unknown (04/09/2024)  Tobacco Use: Low Risk  (04/09/2024)     Readmission Risk Interventions    04/10/2024    2:01 PM  Readmission Risk Prevention Plan  Post Dischage Appt Complete  Medication Screening Complete  Transportation Screening Complete

## 2024-04-28 ENCOUNTER — Ambulatory Visit: Admitting: Family Medicine

## 2024-04-28 ENCOUNTER — Encounter: Payer: Self-pay | Admitting: Family Medicine

## 2024-04-28 VITALS — BP 136/84 | HR 90 | Ht 66.0 in | Wt 169.0 lb

## 2024-04-28 DIAGNOSIS — M25512 Pain in left shoulder: Secondary | ICD-10-CM

## 2024-04-28 DIAGNOSIS — M542 Cervicalgia: Secondary | ICD-10-CM

## 2024-04-28 DIAGNOSIS — G8929 Other chronic pain: Secondary | ICD-10-CM | POA: Diagnosis not present

## 2024-04-28 NOTE — Patient Instructions (Signed)
 Thank you for coming in today.

## 2024-04-28 NOTE — Progress Notes (Signed)
   I, Miquel Amen, CMA acting as a scribe for Garlan Juniper, MD.  Sophia Salazar is a 63 y.o. female who presents to [facility] at [facility] today for 67-month f/u neck and L shoulder pain. Pt was last seen by Dr. Alease Hunter on 02/27/24 and was referred to [facility] PT.  Today, pt reports continued neck and left shoulder pain. Was unable to attend PT in April d/t dx of RSV in early April and anaphylaxis in late April (angioedema related to Lisinopril).   Dx imaging: 02/27/24 L shoulder & c-spine XR  Pertinent review of systems: No fevers or chills  Relevant historical information: Right shoulder rotator cuff repair.  Recently admitted and discharged from the hospital with angioedema due to lisinopril.   Exam:  BP 136/84   Pulse 90   Ht 5\' 6"  (1.676 m)   Wt 169 lb (76.7 kg)   SpO2 98%   BMI 27.28 kg/m  General: Well Developed, well nourished, and in no acute distress.   MSK: Left shoulder decreased range of motion.       Assessment and Plan: 63 y.o. female with chronic left shoulder pain.  Unfortunately Sophia Salazar was not able to go to much physical therapy last month as she was admitted to the hospital with angioedema.  She is continuing PT which we will progress.  Plan to reassess in about a month and if not better consider either injection or MRI.   PDMP not reviewed this encounter. No orders of the defined types were placed in this encounter.  No orders of the defined types were placed in this encounter.    Discussed warning signs or symptoms. Please see discharge instructions. Patient expresses understanding.   The above documentation has been reviewed and is accurate and complete Garlan Juniper, M.D.

## 2024-05-07 ENCOUNTER — Encounter (HOSPITAL_COMMUNITY): Payer: Self-pay | Admitting: Orthopedic Surgery

## 2024-05-09 ENCOUNTER — Ambulatory Visit
Admission: RE | Admit: 2024-05-09 | Discharge: 2024-05-09 | Disposition: A | Discharge status: Home / Self Care | Source: Ambulatory Visit | Attending: Family | Admitting: Family

## 2024-05-09 DIAGNOSIS — Z1231 Encounter for screening mammogram for malignant neoplasm of breast: Secondary | ICD-10-CM

## 2024-05-15 ENCOUNTER — Other Ambulatory Visit: Payer: Self-pay | Admitting: Family

## 2024-05-15 DIAGNOSIS — R928 Other abnormal and inconclusive findings on diagnostic imaging of breast: Secondary | ICD-10-CM

## 2024-05-27 ENCOUNTER — Other Ambulatory Visit: Payer: Self-pay | Admitting: Family

## 2024-05-27 ENCOUNTER — Ambulatory Visit
Admission: RE | Admit: 2024-05-27 | Discharge: 2024-05-27 | Disposition: A | Discharge status: Home / Self Care | Source: Ambulatory Visit | Attending: Family | Admitting: Family

## 2024-05-27 DIAGNOSIS — R921 Mammographic calcification found on diagnostic imaging of breast: Secondary | ICD-10-CM

## 2024-05-27 DIAGNOSIS — R928 Other abnormal and inconclusive findings on diagnostic imaging of breast: Secondary | ICD-10-CM

## 2024-05-28 ENCOUNTER — Other Ambulatory Visit: Payer: Self-pay | Admitting: Surgical

## 2024-05-29 ENCOUNTER — Ambulatory Visit: Admitting: Family Medicine

## 2024-05-30 NOTE — Progress Notes (Deleted)
   Joanna Muck, PhD, LAT, ATC acting as a scribe for Garlan Juniper, MD.  Sophia Salazar is a 63 y.o. female who presents to [facility] at [facility] today for f/u neck and L shoulder pain. Pt was last seen by Dr. Alease Hunter on 04/28/24 and was advised to cont PT at [facility].   Today, pt reports ***  Dx imaging: 02/27/24 L shoulder & c-spine XR  Pertinent review of systems: ***  Relevant historical information: ***   Exam:  There were no vitals taken for this visit. General: Well Developed, well nourished, and in no acute distress.   MSK: ***    Lab and Radiology Results No results found for this or any previous visit (from the past 72 hours). MM Digital Diagnostic Unilat R Result Date: 05/27/2024 CLINICAL DATA:  Recall RIGHT breast calcifications EXAM: DIGITAL DIAGNOSTIC UNILATERAL RIGHT MAMMOGRAM TECHNIQUE: Right digital diagnostic mammography was performed. COMPARISON:  Previous exam(s). ACR Breast Density Category b: There are scattered areas of fibroglandular density. FINDINGS: RIGHT: Mammogram: Full field and spot magnification views of the RIGHT breast were obtained. 4 mm group of round and amorphous calcifications seen in the lower inner RIGHT breast at anterior to middle depth. 7 mm group of round and amorphous calcifications seen in the lower inner RIGHT breast at middle depth. Additional calcifications within the RIGHT breast have a benign not dystrophic appearance. IMPRESSION: 1. Indeterminate 4 mm group of round and amorphous calcifications in the lower inner RIGHT breast at anterior to middle depth. 2. Indeterminate 7 mm group of round and amorphous calcifications in the lower inner RIGHT breast at middle depth. RECOMMENDATION: Stereotactic guided biopsy of 4 and 7 mm groups of RIGHT breast calcifications. I have discussed the findings and recommendations with the patient. The biopsy procedure was explained to the patient and questions were answered. Patient expressed  their understanding of the biopsy recommendation. Patient will be scheduled for biopsy at her earliest convenience by the schedulers. Ordering provider will be notified. If applicable, a reminder letter will be sent to the patient regarding the next appointment. BI-RADS CATEGORY  4: Suspicious. Electronically Signed   By: Elester Grim M.D.   On: 05/27/2024 13:12       Assessment and Plan: 63 y.o. female with ***   PDMP not reviewed this encounter. No orders of the defined types were placed in this encounter.  No orders of the defined types were placed in this encounter.    Discussed warning signs or symptoms. Please see discharge instructions. Patient expresses understanding.   ***

## 2024-06-02 ENCOUNTER — Ambulatory Visit: Admitting: Family Medicine

## 2024-06-04 ENCOUNTER — Other Ambulatory Visit: Payer: Self-pay

## 2024-06-04 ENCOUNTER — Ambulatory Visit: Admitting: Family Medicine

## 2024-06-04 ENCOUNTER — Telehealth: Payer: Self-pay

## 2024-06-04 ENCOUNTER — Ambulatory Visit (INDEPENDENT_AMBULATORY_CARE_PROVIDER_SITE_OTHER)

## 2024-06-04 VITALS — BP 162/92 | HR 101 | Ht 66.0 in | Wt 168.0 lb

## 2024-06-04 DIAGNOSIS — G8929 Other chronic pain: Secondary | ICD-10-CM

## 2024-06-04 DIAGNOSIS — M25552 Pain in left hip: Secondary | ICD-10-CM

## 2024-06-04 DIAGNOSIS — M25562 Pain in left knee: Secondary | ICD-10-CM

## 2024-06-04 DIAGNOSIS — M25512 Pain in left shoulder: Secondary | ICD-10-CM | POA: Diagnosis not present

## 2024-06-04 MED ORDER — TRAMADOL HCL 50 MG PO TABS: 50.0000 mg | ORAL_TABLET | Freq: Every day | ORAL | 0 refills | Status: DC | PRN

## 2024-06-04 NOTE — Progress Notes (Signed)
   Joanna Muck, PhD, LAT, ATC acting as a scribe for Garlan Juniper, MD.  Sophia Salazar is a 63 y.o. female who presents to [facility] at [facility] today for f/u neck and L shoulder pain. Pt was last seen by Dr. Alease Hunter on 04/28/24 and was advised to cont PT at [facility].   Today, pt reports neck is slightly improved. L shoulder pain continues. She also c/o L knee pain that's giving out on her. L hip is also painful. Pt locates pain to the lateral aspect of her L hip. Pain is interrupting her sleep at night.   Dx imaging: 02/27/24 L shoulder & c-spine XR  Pertinent review of systems: No fevers or chills  Relevant historical information: Right shoulder SLAP tear.   Exam:  BP (!) 162/92   Pulse (!) 101   Ht 5' 6 (1.676 m)   Wt 168 lb (76.2 kg)   SpO2 97%   BMI 27.12 kg/m  General: Well Developed, well nourished, and in no acute distress.   MSK: Left shoulder normal-appearing normal motion pain with abduction.  Left hip normal-appearing tender palpation greater trochanter hip abduction strength is decreased.  Left knee: Minimal effusion normal-appearing otherwise normal motion with crepitation.    Lab and Radiology Results  X-ray images left knee obtained today personally and independently interpreted. Mild medial DJD.  No acute fractures are visible. Await formal radiology review     Assessment and Plan: 63 y.o. female with chronic left shoulder pain and chronic but recurrence of left hip and left knee pain.  Patient is currently engaged in physical therapy at [facility] on [address] for her shoulder which should be quite helpful.  Will add the hip and the knee pain on this.  Hip pain due to trochanteric bursitis and knee pain due to mild DJD.  Plan to recheck in 1 month if not improving consider injection.  Tramadol  refilled.   PDMP reviewed during this encounter. Orders Placed This Encounter  Procedures   DG Knee AP/LAT W/[place] Left    Standing  Status:   Future    Number of Occurrences:   1    Expiration Date:   07/04/2024    Reason for Exam (SYMPTOM  OR DIAGNOSIS REQUIRED):   left knee pain    Preferred imaging location?:   [place]    Is patient pregnant?:   No   Ambulatory referral to Physical Therapy    Referral Priority:   Routine    Referral Type:   Physical Medicine    Referral Reason:   Specialty Services Required    Requested Specialty:   Physical Therapy    Number of Visits Requested:   1   Meds ordered this encounter  Medications   traMADol  (ULTRAM ) 50 MG tablet    Sig: Take 1 tablet (50 mg total) by mouth daily as needed for moderate pain (pain score 4-6) or severe pain (pain score 7-10).    Dispense:  30 tablet    Refill:  0     Discussed warning signs or symptoms. Please see discharge instructions. Patient expresses understanding.   The above documentation has been reviewed and is accurate and complete Garlan Juniper, M.D.

## 2024-06-04 NOTE — Patient Instructions (Signed)
 Thank you for coming in today.   Please get an Xray today before you leave   A referral for physical therapy has been submitted. A representative from the physical therapy office will contact you to coordinate scheduling after confirming your benefits with your insurance provider. If you do not hear from the physical therapy office within the next 1-2 weeks, please let us know.   See you back in 1 month

## 2024-06-04 NOTE — Telephone Encounter (Signed)
 Prior Authorization initiated for TRAMADOL  via [email or web address] KEY: [id number]

## 2024-06-06 NOTE — Telephone Encounter (Signed)
 Additional information has been faxed to [employer].

## 2024-06-06 NOTE — Telephone Encounter (Signed)
 More information is needed to complete the authorization.  Call back number: [phone] or [phone] REF# [id number]

## 2024-06-06 NOTE — Telephone Encounter (Signed)
 Additional information requested. Visit notes and DI printed to fax back to [facility].

## 2024-06-09 ENCOUNTER — Ambulatory Visit: Payer: Self-pay | Admitting: Family Medicine

## 2024-06-09 NOTE — Telephone Encounter (Signed)
 Prior Authorization for TRAMADOL  DENIED   Dear Dr. Garlan Juniper: [employer] reviewed your TRAMADOL  HCL 50 MG TABLET request for the above-identified  member, and it is denied for the following reason: because we did not see what we need to  approve the drug you asked for, (tramadol ). We may be able to approve this drug in a certain  situation (when your doctor has reviewed a certain document on how to treat pain [the [facility] statement on use of controlled substances for the treatment of pain]).  We do not see that this applies to you. If this applies to you, we may need more information (if  your doctor is ordering this drug under certain conditions; if your doctor has checked your use  of drugs like this on a certain system; if your doctor has reviewed certain documents on how to  treat pain). We based this decision on your health plan's prior authorization clinical criteria  named Opioid Analgesics.

## 2024-06-09 NOTE — Progress Notes (Signed)
 Left knee x-ray shows mild arthritis.

## 2024-06-10 NOTE — Telephone Encounter (Signed)
 We are having trouble getting the tramadol  approved which is bizarre.  This medicine is not expensive.  You may want to purchase it yourself if you need it urgently.

## 2024-07-07 ENCOUNTER — Ambulatory Visit: Admitting: Family Medicine

## 2024-07-10 ENCOUNTER — Ambulatory Visit
Admission: RE | Admit: 2024-07-10 | Discharge: 2024-07-10 | Disposition: A | Discharge status: Home / Self Care | Source: Ambulatory Visit | Attending: Family | Admitting: Family

## 2024-07-10 DIAGNOSIS — R921 Mammographic calcification found on diagnostic imaging of breast: Secondary | ICD-10-CM

## 2024-07-10 HISTORY — PX: BREAST BIOPSY: SHX20

## 2024-07-11 LAB — SURGICAL PATHOLOGY

## 2024-08-22 ENCOUNTER — Encounter (HOSPITAL_BASED_OUTPATIENT_CLINIC_OR_DEPARTMENT_OTHER): Payer: Self-pay | Admitting: Physician Assistant

## 2024-08-22 ENCOUNTER — Ambulatory Visit (INDEPENDENT_AMBULATORY_CARE_PROVIDER_SITE_OTHER): Admitting: Physician Assistant

## 2024-08-22 ENCOUNTER — Ambulatory Visit (INDEPENDENT_AMBULATORY_CARE_PROVIDER_SITE_OTHER)

## 2024-08-22 DIAGNOSIS — M25552 Pain in left hip: Secondary | ICD-10-CM

## 2024-08-22 NOTE — Progress Notes (Signed)
 Office Visit Note   Patient: Sophia Salazar           Date of Birth: 1961/03/29           MRN: [id number] Visit Date: 08/22/2024              Requested by: [facility] [address] [place],  [place] [address] PCP: [facility]   Assessment & Plan: Visit Diagnoses:  1. Pain of left hip     Plan: Patient is a pleasant 63 year old woman who comes in today with a 1 month history of swelling on her lateral left hip.  She said she sustained a fall onto the hip about a month ago.  She did have quite a bit of bruising and a large hematoma.  She did not think much of this.  She is slowly getting better but wants to make sure she did not break anything.  She did show me the pictures from a month ago she does have findings consistent with a resolving hematoma but no red flags no redness no significant tenderness.  She is able to activate all the muscles of her hip has good extension and flexion has no pain with manipulation of the hip.  She is mostly just has pain when she sits on it.  I told her at this point I would continue to use warm compresses in the area she could also wear a pair of bike shorts to give it some compression.  Her x-rays did not show any fracture can follow-up if she does not get better in a few weeks  Follow-Up Instructions: No follow-ups on file.   Orders:  Orders Placed This Encounter  Procedures   DG Hip Unilat W OR W/O Pelvis Min 4 Views Left   No orders of the defined types were placed in this encounter.     Procedures: No procedures performed   Clinical Data: No additional findings.   Subjective: Chief Complaint  Patient presents with   Left Hip - Pain    HPI pleasant 63 year old woman with a 1 month history of a lump on her left lateral hip after falling.  She said at first it was a very large hematoma and showed me a picture.  It was very painful.  She says it slowly getting better.  She denies any weakness.  She denies  any groin pain.  She mostly has discomfort when she sits on that part of her hip  Review of Systems  All other systems reviewed and are negative.    Objective: Vital Signs: There were no vitals taken for this visit.  Physical Exam Constitutional:      Appearance: Normal appearance.  Pulmonary:     Effort: Pulmonary effort is normal.  Skin:    General: Skin is warm and dry.  Neurological:     General: No focal deficit present.     Mental Status: She is alert and oriented to person, place, and time.  Psychiatric:        Mood and Affect: Mood normal.        Behavior: Behavior normal.     Ortho Exam Examination of her left hip she is neurovascular intact she has good internal/external rotation good flexion and extension.  She has palpable fullness but no fluctuance no erythema minimally tender to palpation no ecchymosis is noted Specialty Comments:  No specialty comments available.  Imaging: No results found.   PMFS History: Patient Active Problem List  Diagnosis Date Noted   Anaphylaxis 04/10/2024   Angioedema 04/09/2024   Nontraumatic complete tear of right rotator cuff 01/24/2024   Synovitis of right shoulder 01/24/2024   Biceps rupture, proximal, right, sequela 01/24/2024   Degenerative superior labral anterior-to-posterior (SLAP) tear of right shoulder 01/24/2024   Past Medical History:  Diagnosis Date   Anxiety    Arthritis    Asthma    History of kidney stones 2024   Hypertension    Stroke (HCC)    1994; no peripheral vision    History reviewed. No pertinent family history.  Past Surgical History:  Procedure Laterality Date   BREAST BIOPSY Right 07/10/2024   MM RT BREAST BX W LOC DEV 1ST LESION IMAGE BX SPEC STEREO GUIDE 07/10/2024 [facility]   BREAST BIOPSY Right 07/10/2024   MM RT BREAST BX W LOC DEV EA AD LESION IMG BX SPEC STEREO GUIDE 07/10/2024 [facility] M[facility]MOGR[facility]   COSMETIC SURGERY Left    on R arm   SHOULDER ARTHROSCOPY WITH SUBACROMIAL  DECOMPRESSION, ROTATOR CUFF REPAIR AND BICEP TENDON REPAIR Right 01/08/2024   Procedure: RIGHT SHOULDER ARTHROSCOPY, DEBRIDEMENT, MINI OPEN BICEP TENODESIS AND ROTATOR CUFF TEAR REPAIR;  Surgeon: Addie Cordella Hamilton, MD;  Location: [facility] OR;  Service: Orthopedics;  Laterality: Right;   TUBAL LIGATION     Social History   Occupational History   Not on file  Tobacco Use   Smoking status: Never   Smokeless tobacco: Never  Vaping Use   Vaping status: Never Used  Substance and Sexual Activity   Alcohol use: Yes    Alcohol/week: 1.0 standard drink of alcohol    Types: 1 Glasses of wine per week   Drug use: No   Sexual activity: Not on file

## 2024-09-09 ENCOUNTER — Ambulatory Visit: Admitting: Family Medicine

## 2024-09-10 ENCOUNTER — Ambulatory Visit: Admitting: Family Medicine

## 2024-09-10 ENCOUNTER — Encounter: Payer: Self-pay | Admitting: Family Medicine

## 2024-09-10 VITALS — BP 150/90 | HR 97 | Ht 66.0 in | Wt 171.0 lb

## 2024-09-10 DIAGNOSIS — M542 Cervicalgia: Secondary | ICD-10-CM

## 2024-09-10 DIAGNOSIS — M25552 Pain in left hip: Secondary | ICD-10-CM | POA: Diagnosis not present

## 2024-09-10 DIAGNOSIS — G8929 Other chronic pain: Secondary | ICD-10-CM | POA: Diagnosis not present

## 2024-09-10 DIAGNOSIS — M25512 Pain in left shoulder: Secondary | ICD-10-CM | POA: Diagnosis not present

## 2024-09-10 NOTE — Progress Notes (Signed)
 I, Sophia Salazar, CMA acting as a scribe for Sophia Lloyd, MD.  Sophia Salazar is a 63 y.o. female who presents to [facility] today for f/u L knee, L hip, and L shoulder pain. Pt was last seen by Dr. Lloyd on 06/04/24 and was advised to cont PT.  Today, pt reports completing PT. Continues HEP. Continues to have some shoulder pain, thought it would be much better after surgery than it is. Sx exacerbated by activity and side sleeping. ROM has improved.  Notes that the left hip still had a bulge on it. Had more recent XR done. The area is TTP.  Continues to have intermittent knee pain. Remains active.  Also c/o neck pain, midline, has gotten benefit with Chiro treatments in the past but has been unable to find one more recently that accepts Medicaid. Intermittent n/t in B UE, especially when driving. Taking Tramadol  prn.   She notes lateral hip pain occurring after a fall occurring about 6 weeks ago.  She notes some still swelling on the lateral left hip but otherwise is feeling pretty well.  Dx imaging: 02/27/24 L shoulder & c-spine XR   Pertinent review of systems: No fevers or chills  Relevant historical information: Right shoulder surgery.  Cervical radiculopathy.   Exam:  BP (!) 150/90   Pulse 97   Ht 5' 6 (1.676 m)   Wt 171 lb (77.6 kg)   SpO2 96%   BMI 27.60 kg/m  General: Well Developed, well nourished, and in no acute distress.   MSK: C-spine normal appearing normal cervical motion upper extremity strength is intact.  Left lateral hip not particularly tender to palpation.  Normal strength.    Lab and Radiology Results  EXAM: 4 VIEW(S) XRAY OF THE LEFT HIP 08/22/2024 09:19:19 AM   COMPARISON: None available.   CLINICAL HISTORY: Pain.   FINDINGS:   BONES AND JOINTS: There is buttressing of the lateral superior aspect of the left femoral head which may be a source of cam-type femoroacetabular impingement. Mild osteoarthritis of  the left hip. Lower lumbar degenerative changes.   SOFT TISSUES: The soft tissues are unremarkable.   IMPRESSION: 1. Buttressing of the lateral superior aspect of the left femoral head, associated with cam-type femoroacetabular impingement. 2. Mild osteoarthritis of the left hip.   Electronically signed by: Franky Stanford MD 09/02/2024 02:23 PM EDT RP Workstation: [id number]  EXAM: CERVICAL SPINE - 2-3 VIEW   COMPARISON:  Cervical spine MRI and radiographs from 2022   FINDINGS: Straightening of the normal cervical lordosis and age advanced degenerative cervical spondylosis with degenerative disc disease and facet disease most notably at C5-6 and C6-7. No acute bony findings or abnormal prevertebral soft tissue swelling. The C1-2 articulations are maintained. The lung apices are grossly clear. Remote ununited right clavicle fracture.   IMPRESSION: 1. Age advanced degenerative cervical spondylosis with degenerative disc disease and facet disease most notably at C5-6 and C6-7. 2. No acute bony findings.     Electronically Signed   By: MYRTIS Stammer M.D.   On: 03/12/2024 14:16  I, Sophia Salazar, personally (independently) visualized and performed the interpretation of the images attached in this note.   Assessment and Plan: 63 y.o. female with right arm and shoulder pain improving following shoulder surgery earlier this year.  She may have a component of cervical radiculopathy.  Her symptoms are not worse enough at this time to consider epidural steroid injection.  Plan to continue home exercise program.  Left hip pain pain is more lateral and related to trochanteric bursitis or perhaps even a hematoma/seroma.  Plan for continued home exercise program.  If not improving we can aspirate or do an injection anytime.   PDMP reviewed during this encounter. No orders of the defined types were placed in this encounter.  No orders of the defined types were placed in this  encounter.    Discussed warning signs or symptoms. Please see discharge instructions. Patient expresses understanding.   The above documentation has been reviewed and is accurate and complete Sophia Salazar, M.D.

## 2024-09-10 NOTE — Patient Instructions (Addendum)
 Thank you for coming in today.   Let us  know where you would like for us  to send the referral for Chiropractic treatments.   Let us  know if you would like for us  to order a neck injection for your arm symptoms.   We can drain the fluid from the hip at any point.   See you back as needed.

## 2024-10-27 ENCOUNTER — Encounter: Payer: Self-pay | Admitting: Radiology

## 2024-12-12 ENCOUNTER — Other Ambulatory Visit: Payer: Self-pay | Admitting: Family Medicine

## 2024-12-12 NOTE — Telephone Encounter (Signed)
 Last OV 09/10/24 Next OV not scheduled  Last refill 06/04/24 Qty #30/0
